# Patient Record
Sex: Male | Born: 1935 | Race: White | Hispanic: No | Marital: Married | Smoking: Never smoker
Health system: Southern US, Community
[De-identification: ages and names within clinical notes are randomized; demographics above are authoritative.]

## PROBLEM LIST (undated history)

## (undated) DIAGNOSIS — H269 Unspecified cataract: Secondary | ICD-10-CM

## (undated) DIAGNOSIS — C61 Malignant neoplasm of prostate: Secondary | ICD-10-CM

## (undated) DIAGNOSIS — J449 Chronic obstructive pulmonary disease, unspecified: Secondary | ICD-10-CM

## (undated) DIAGNOSIS — N400 Enlarged prostate without lower urinary tract symptoms: Secondary | ICD-10-CM

## (undated) DIAGNOSIS — R12 Heartburn: Secondary | ICD-10-CM

## (undated) DIAGNOSIS — E669 Obesity, unspecified: Secondary | ICD-10-CM

## (undated) DIAGNOSIS — I739 Peripheral vascular disease, unspecified: Secondary | ICD-10-CM

## (undated) DIAGNOSIS — M1990 Unspecified osteoarthritis, unspecified site: Secondary | ICD-10-CM

## (undated) DIAGNOSIS — K219 Gastro-esophageal reflux disease without esophagitis: Secondary | ICD-10-CM

## (undated) DIAGNOSIS — I10 Essential (primary) hypertension: Secondary | ICD-10-CM

## (undated) HISTORY — DX: Malignant neoplasm of prostate: C61

## (undated) HISTORY — PX: COLONOSCOPY W/ BIOPSIES AND POLYPECTOMY: SHX1376

## (undated) HISTORY — DX: Essential (primary) hypertension: I10

## (undated) HISTORY — DX: Heartburn: R12

## (undated) HISTORY — DX: Benign prostatic hyperplasia without lower urinary tract symptoms: N40.0

## (undated) HISTORY — PX: ROTATOR CUFF REPAIR: SHX139

## (undated) HISTORY — DX: Obesity, unspecified: E66.9

## (undated) HISTORY — DX: Chronic obstructive pulmonary disease, unspecified: J44.9

## (undated) HISTORY — PX: TONSILLECTOMY: SUR1361

## (undated) HISTORY — DX: Peripheral vascular disease, unspecified: I73.9

---

## 1999-07-11 ENCOUNTER — Other Ambulatory Visit: Admission: RE | Admit: 1999-07-11 | Discharge: 1999-07-11 | Payer: Self-pay | Admitting: Gastroenterology

## 2002-11-01 ENCOUNTER — Encounter: Payer: Self-pay | Admitting: Gastroenterology

## 2002-11-08 ENCOUNTER — Encounter: Payer: Self-pay | Admitting: Gastroenterology

## 2004-05-28 ENCOUNTER — Ambulatory Visit: Payer: Self-pay | Admitting: Internal Medicine

## 2004-07-09 ENCOUNTER — Ambulatory Visit: Payer: Self-pay | Admitting: Internal Medicine

## 2004-08-21 ENCOUNTER — Ambulatory Visit: Payer: Self-pay | Admitting: Internal Medicine

## 2004-11-21 ENCOUNTER — Ambulatory Visit: Payer: Self-pay | Admitting: Internal Medicine

## 2005-02-19 ENCOUNTER — Ambulatory Visit: Payer: Self-pay | Admitting: Internal Medicine

## 2005-02-20 ENCOUNTER — Ambulatory Visit: Payer: Self-pay

## 2005-08-25 ENCOUNTER — Ambulatory Visit: Payer: Self-pay | Admitting: Internal Medicine

## 2006-03-31 ENCOUNTER — Ambulatory Visit (HOSPITAL_COMMUNITY): Admission: RE | Admit: 2006-03-31 | Discharge: 2006-03-31 | Payer: Self-pay | Admitting: Orthopedic Surgery

## 2006-04-07 ENCOUNTER — Ambulatory Visit (HOSPITAL_COMMUNITY): Admission: RE | Admit: 2006-04-07 | Discharge: 2006-04-08 | Payer: Self-pay | Admitting: Orthopedic Surgery

## 2006-09-28 ENCOUNTER — Ambulatory Visit: Payer: Self-pay | Admitting: Internal Medicine

## 2006-09-28 LAB — CONVERTED CEMR LAB
ALT: 31 units/L (ref 0–53)
AST: 31 units/L (ref 0–37)
Albumin: 4.3 g/dL (ref 3.5–5.2)
BUN: 12 mg/dL (ref 6–23)
Basophils Absolute: 0 10*3/uL (ref 0.0–0.1)
Bilirubin Urine: NEGATIVE
Bilirubin, Direct: 0.2 mg/dL (ref 0.0–0.3)
CRP, High Sensitivity: <1 — ABNORMAL LOW (ref 0.00–5.00)
Chloride: 105 meq/L (ref 96–112)
Cholesterol: 147 mg/dL (ref 0–200)
Eosinophils Relative: 2.4 % (ref 0.0–5.0)
GFR calc Af Amer: 105 mL/min
GFR calc non Af Amer: 87 mL/min
Hemoglobin: 15 g/dL (ref 13.0–17.0)
LDL Cholesterol: 96 mg/dL (ref 0–99)
MCV: 91.8 fL (ref 78.0–100.0)
Nitrite: NEGATIVE
RBC: 4.69 M/uL (ref 4.22–5.81)
RDW: 12.5 % (ref 11.5–14.6)
TSH: 0.94 microintl units/mL (ref 0.35–5.50)
Total Bilirubin: 0.9 mg/dL (ref 0.3–1.2)
Total Protein: 6.9 g/dL (ref 6.0–8.3)
Triglycerides: 127 mg/dL (ref 0–149)
Urine Glucose: NEGATIVE mg/dL
VLDL: 25 mg/dL (ref 0–40)
WBC: 6.5 10*3/uL (ref 4.5–10.5)

## 2006-10-05 ENCOUNTER — Ambulatory Visit: Payer: Self-pay | Admitting: Internal Medicine

## 2006-10-05 LAB — CONVERTED CEMR LAB
BUN: 16 mg/dL (ref 6–23)
CO2: 31 meq/L (ref 19–32)
Chloride: 107 meq/L (ref 96–112)
Creatinine, Ser: 1.1 mg/dL (ref 0.4–1.5)
GFR calc Af Amer: 83 mL/min
GFR calc non Af Amer: 69 mL/min
Glucose, Bld: 97 mg/dL (ref 70–99)
Potassium: 4.6 meq/L (ref 3.5–5.1)
Sodium: 144 meq/L (ref 135–145)

## 2006-11-16 ENCOUNTER — Ambulatory Visit: Payer: Self-pay | Admitting: Internal Medicine

## 2007-08-16 ENCOUNTER — Encounter: Payer: Self-pay | Admitting: Internal Medicine

## 2007-08-16 DIAGNOSIS — E119 Type 2 diabetes mellitus without complications: Secondary | ICD-10-CM

## 2007-08-16 DIAGNOSIS — N138 Other obstructive and reflux uropathy: Secondary | ICD-10-CM

## 2007-08-16 DIAGNOSIS — I739 Peripheral vascular disease, unspecified: Secondary | ICD-10-CM

## 2007-08-16 DIAGNOSIS — I10 Essential (primary) hypertension: Secondary | ICD-10-CM

## 2007-08-16 DIAGNOSIS — R0609 Other forms of dyspnea: Secondary | ICD-10-CM

## 2007-08-16 DIAGNOSIS — R0989 Other specified symptoms and signs involving the circulatory and respiratory systems: Secondary | ICD-10-CM | POA: Insufficient documentation

## 2007-08-16 DIAGNOSIS — N401 Enlarged prostate with lower urinary tract symptoms: Secondary | ICD-10-CM

## 2007-08-16 DIAGNOSIS — Z8719 Personal history of other diseases of the digestive system: Secondary | ICD-10-CM

## 2007-08-17 ENCOUNTER — Ambulatory Visit: Payer: Self-pay | Admitting: Internal Medicine

## 2007-09-28 ENCOUNTER — Encounter: Payer: Self-pay | Admitting: Internal Medicine

## 2007-10-29 ENCOUNTER — Ambulatory Visit: Payer: Self-pay | Admitting: Internal Medicine

## 2007-10-29 DIAGNOSIS — J449 Chronic obstructive pulmonary disease, unspecified: Secondary | ICD-10-CM | POA: Insufficient documentation

## 2007-10-29 LAB — CONVERTED CEMR LAB
Alkaline Phosphatase: 65 units/L (ref 39–117)
Basophils Relative: 0.1 % (ref 0.0–3.0)
Bilirubin, Direct: 0.1 mg/dL (ref 0.0–0.3)
Calcium: 9.1 mg/dL (ref 8.4–10.5)
Chloride: 105 meq/L (ref 96–112)
Cholesterol: 168 mg/dL (ref 0–200)
Creatinine, Ser: 0.9 mg/dL (ref 0.4–1.5)
Eosinophils Absolute: 0.4 10*3/uL (ref 0.0–0.7)
GFR calc Af Amer: 105 mL/min
GFR calc non Af Amer: 87 mL/min
HCT: 43.3 % (ref 39.0–52.0)
HDL: 22.3 mg/dL — ABNORMAL LOW (ref >39.0)
Hemoglobin: 14.7 g/dL (ref 13.0–17.0)
Leukocytes, UA: NEGATIVE
Lymphocytes Relative: 29.3 % (ref 12.0–46.0)
Monocytes Absolute: 0.6 10*3/uL (ref 0.1–1.0)
Neutro Abs: 3.7 10*3/uL (ref 1.4–7.7)
Neutrophils Relative %: 55.6 % (ref 43.0–77.0)
Potassium: 4.3 meq/L (ref 3.5–5.1)
RBC: 4.58 M/uL (ref 4.22–5.81)
RDW: 12.1 % (ref 11.5–14.6)
Sodium: 139 meq/L (ref 135–145)
Specific Gravity, Urine: 1.01 (ref 1.000–1.03)
Total CHOL/HDL Ratio: 7.5
Total Protein: 6.8 g/dL (ref 6.0–8.3)
Triglycerides: 102 mg/dL (ref 0–149)
Urine Glucose: NEGATIVE mg/dL
VLDL: 20 mg/dL (ref 0–40)

## 2008-03-02 ENCOUNTER — Ambulatory Visit: Payer: Self-pay | Admitting: Internal Medicine

## 2008-03-08 LAB — CONVERTED CEMR LAB
BUN: 14 mg/dL (ref 6–23)
CO2: 29 meq/L (ref 19–32)
Chloride: 104 meq/L (ref 96–112)
Microalb Creat Ratio: 4.5 mg/g (ref 0.0–30.0)
Potassium: 5.1 meq/L (ref 3.5–5.1)
Sodium: 139 meq/L (ref 135–145)

## 2008-05-11 ENCOUNTER — Telehealth (INDEPENDENT_AMBULATORY_CARE_PROVIDER_SITE_OTHER): Payer: Self-pay | Admitting: *Deleted

## 2008-06-19 ENCOUNTER — Encounter: Payer: Self-pay | Admitting: Internal Medicine

## 2008-07-19 ENCOUNTER — Encounter: Payer: Self-pay | Admitting: Internal Medicine

## 2008-08-31 ENCOUNTER — Ambulatory Visit: Payer: Self-pay | Admitting: Internal Medicine

## 2008-10-18 ENCOUNTER — Encounter: Payer: Self-pay | Admitting: Internal Medicine

## 2008-11-09 ENCOUNTER — Ambulatory Visit: Payer: Self-pay | Admitting: Internal Medicine

## 2008-11-09 DIAGNOSIS — J984 Other disorders of lung: Secondary | ICD-10-CM | POA: Insufficient documentation

## 2008-11-13 LAB — CONVERTED CEMR LAB
AST: 23 units/L (ref 0–37)
Albumin: 4.4 g/dL (ref 3.5–5.2)
BUN: 13 mg/dL (ref 6–23)
Basophils Absolute: 0 10*3/uL (ref 0.0–0.1)
Basophils Relative: 0.1 % (ref 0.0–3.0)
Bilirubin Urine: NEGATIVE
Bilirubin, Direct: 0.1 mg/dL (ref 0.0–0.3)
Creatinine, Ser: 0.9 mg/dL (ref 0.4–1.5)
Eosinophils Relative: 1.5 % (ref 0.0–5.0)
GFR calc non Af Amer: 86.16 mL/min (ref >60)
Glucose, Bld: 124 mg/dL — ABNORMAL HIGH (ref 70–99)
Hemoglobin: 15.3 g/dL (ref 13.0–17.0)
Ketones, ur: NEGATIVE mg/dL
LDL Cholesterol: 115 mg/dL — ABNORMAL HIGH (ref 0–99)
Leukocytes, UA: NEGATIVE
Lymphocytes Relative: 26.2 % (ref 12.0–46.0)
Lymphs Abs: 1.7 10*3/uL (ref 0.7–4.0)
MCHC: 33.6 g/dL (ref 30.0–36.0)
MCV: 94.8 fL (ref 78.0–100.0)
Monocytes Relative: 8 % (ref 3.0–12.0)
Neutro Abs: 4.1 10*3/uL (ref 1.4–7.7)
Nitrite: NEGATIVE
RBC: 4.8 M/uL (ref 4.22–5.81)
RDW: 12.3 % (ref 11.5–14.6)
Sodium: 140 meq/L (ref 135–145)
Specific Gravity, Urine: <=1.005 (ref 1.000–1.030)
TSH: 0.89 microintl units/mL (ref 0.35–5.50)
Total Bilirubin: 0.9 mg/dL (ref 0.3–1.2)
Urobilinogen, UA: 0.2 (ref 0.0–1.0)
WBC: 6.4 10*3/uL (ref 4.5–10.5)
pH: 7 (ref 5.0–8.0)

## 2009-02-07 ENCOUNTER — Ambulatory Visit: Payer: Self-pay | Admitting: Internal Medicine

## 2009-02-07 DIAGNOSIS — M25569 Pain in unspecified knee: Secondary | ICD-10-CM | POA: Insufficient documentation

## 2009-04-20 ENCOUNTER — Encounter: Payer: Self-pay | Admitting: Internal Medicine

## 2009-05-08 ENCOUNTER — Ambulatory Visit: Payer: Self-pay | Admitting: Internal Medicine

## 2009-05-28 ENCOUNTER — Encounter: Payer: Self-pay | Admitting: Internal Medicine

## 2009-07-12 ENCOUNTER — Encounter (INDEPENDENT_AMBULATORY_CARE_PROVIDER_SITE_OTHER): Payer: Self-pay | Admitting: *Deleted

## 2009-07-19 ENCOUNTER — Encounter (INDEPENDENT_AMBULATORY_CARE_PROVIDER_SITE_OTHER): Payer: Self-pay | Admitting: *Deleted

## 2009-07-30 ENCOUNTER — Telehealth: Payer: Self-pay | Admitting: Internal Medicine

## 2009-08-01 ENCOUNTER — Ambulatory Visit: Payer: Self-pay | Admitting: Gastroenterology

## 2009-08-01 DIAGNOSIS — K5730 Diverticulosis of large intestine without perforation or abscess without bleeding: Secondary | ICD-10-CM | POA: Insufficient documentation

## 2009-10-08 ENCOUNTER — Ambulatory Visit: Payer: Self-pay | Admitting: Internal Medicine

## 2009-10-08 LAB — CONVERTED CEMR LAB
ALT: 19 units/L (ref 0–53)
AST: 20 units/L (ref 0–37)
Basophils Absolute: 0.1 10*3/uL (ref 0.0–0.1)
CO2: 31 meq/L (ref 19–32)
Eosinophils Absolute: 0.2 10*3/uL (ref 0.0–0.7)
Eosinophils Relative: 2.2 % (ref 0.0–5.0)
HCT: 43.7 % (ref 39.0–52.0)
HDL: 32.3 mg/dL — ABNORMAL LOW (ref >39.00)
MCHC: 34.8 g/dL (ref 30.0–36.0)
Monocytes Absolute: 0.6 10*3/uL (ref 0.1–1.0)
Monocytes Relative: 9.3 % (ref 3.0–12.0)
Platelets: 220 10*3/uL (ref 150.0–400.0)
Potassium: 5.2 meq/L — ABNORMAL HIGH (ref 3.5–5.1)
Sodium: 140 meq/L (ref 135–145)
TSH: 0.82 microintl units/mL (ref 0.35–5.50)
Total CHOL/HDL Ratio: 5
Triglycerides: 166 mg/dL — ABNORMAL HIGH (ref 0.0–149.0)
VLDL: 33.2 mg/dL (ref 0.0–40.0)

## 2009-11-21 ENCOUNTER — Encounter: Payer: Self-pay | Admitting: Internal Medicine

## 2010-04-11 NOTE — Letter (Signed)
Summary: [facility]  [facility]   Imported By: Sherian Rein 04/27/2009 10:08:43  _____________________________________________________________________  External Attachment:    Type:   Image     Comment:   External Document

## 2010-04-11 NOTE — Assessment & Plan Note (Signed)
Summary: Primary svc/ f/u    Primary Provider/Referring Provider:  [name]  CC:  3 month followup.  Pt denies any complaints today.[name]  History of Present Illness: 77  yowm quit smoking 1980  with HBP and chronic nonprogressive doe and PFT's  11/16/2006 FEV1 112% but ratio 57% no change ex tol .  main problem occurs walking uphill carrying buckets of beans not bothered by cough, daytime or nocturnal. no variablity on good days vs bad, every day is the same.  He has has also developed aodm II controlled with diet since 2008, no overt symptoms  10/29/07 cpx fbx 146,  hdl 26  March 02, 2008  working on wt loss thru diet but no ex.  no  polyuria/dypsia.   November 09, 2008 comprehensive eval   ov for bp check with  no tia or claudicaiton symptoms, cbg's typically < 120, no sob though avoiding ex due to heat plays golf.   February 07, 2009 3 month followup.  Pain in rt calf has resolved.  Pt c/o pain in both knees- especially at night and when walking up and down steps.  aleve helps but rarely using.  May 08, 2009 3 month followup.   Still rarely uses aleve despite new right lateral thigh/knee pain esp when lying down. no back pain, once a week maybe twice, no more, no  back pain or leg weakness. Pt denies any significant sore throat, dysphagia, itching, sneezing,  nasal congestion or excess secretions,  fever, chills, sweats, unintended wt loss, pleuritic or exertional cp, hempoptysis, change in activity tolerance  orthopnea pnd or leg swelling   Current Medications (verified): 1)  Aspirin 81 Mg  Tbec (Aspirin) .... Once Daily 2)  Micardis 20 Mg  Tabs (Telmisartan) .... Once Daily 3)  Aleve 220 Mg  Tabs (Naproxen Sodium) .... As Needed 4)  Finasteride 5 Mg Tabs (Finasteride) .[name].. 1 Once Daily 5)  Tamsulosin Hcl 0.4 Mg Caps (Tamsulosin Hcl) .[name].. 1 Once Daily  Allergies (verified): No Known Drug Allergies  Past History:  Past Medical History: Obesity     -  Target wt  =   171 for BMI <  26  HYPERTENSION (ICD-401.9)   - neg cardiolyte 12/06 DM (ICD-250.00)   - dx 7/08  BENIGN PROSTATIC HYPERTROPHY, HX OF (ICD-V13.8) fu by [name] with elevated psa DIVERTICULITIS, HX OF (ICD-V12.79................................................[name]    - colonoscopy 11/08/02 PVD (ICD-443.9)   - asym pedal pulses, asymptomatic COPD    -  PFT's 11/16/06 FEV1 3.0  (112%) with ratio 57 % and no rev, nl dlco Health Maintenance............................................................................[name]    - Td  2006,   -  Pneumovax 2004   -  CPX November 09, 2008     Vital Signs:  Patient profile:   77 year old male Weight:      188 pounds O2 Sat:      94 % on Room air Temp:     97.7 degrees F oral Pulse rate:   88 / minute BP sitting:   142 / 70  (left arm)  Vitals Entered By: Vernie Murders (May 08, 2009 9:11 AM)  O2 Flow:  Room air  Physical Exam  Additional Exam:  Robust wm, alert and approp ambulatory wm nad  wt  187 October 29, 2007 > 182 November 09, 2008 > 186 February 07, 2009 > 188 May 08, 2009 > 188 May 09, 2009  HEENT:dention ok with lower dentures, nl  turbinates, and orophanx.  Neck without JVD/Nodes/TM Lungs clear to A and P bilaterally without cough on insp or exp maneuvers, mild pectus excavatum and barrel chest RRR no s3 or murmur or increase in P2. no edema. Pulses in feet slt asym esp reduced on right  Abd soft and benign with nl excursion in the supine position.  Ext warm without calf tenderness, cyanosis clubbing  MS  nl gait, no limitations on joint manipulation of neck, shoulders, hips, knees, but mild crepitance both knees   Impression & Recommendations:  Problem # 1:  ARTHRALGIA UNSPECIFIED SITE (ICD-719.40) no evidence of sign radiculopathy or back issues, so  ok to rx with nsaids only  Problem # 2:  HYPERTENSION (ICD-401.9)  His updated medication list for this problem includes:    Micardis 20 Mg Tabs (Telmisartan) ..... Once  daily    Each maintenance medication was reviewed in detail including most importantly the difference between maintenance and as needed and under what circumstances the prns are to be used. See instructions for specific recommendations   Medications Added to Medication List This Visit: 1)  Tamsulosin Hcl 0.4 Mg Caps (Tamsulosin hcl) .[name].. 1 once daily  Other Orders: Est. Patient Level III ([address])  Patient Instructions: 1)  Aleve with meals will help with joint muscle pain 2)  Please schedule a follow-up appointment in 6 months for CPX

## 2010-04-11 NOTE — Letter (Signed)
[facility]   Imported By: Sherian Rein 06/07/2009 14:17:44  _____________________________________________________________________  External Attachment:    Type:   Image     Comment:   External Document

## 2010-04-11 NOTE — Progress Notes (Signed)
Summary: refill  Phone Note Call from Patient Call back at Home Phone [phone]   Caller: Spouse//lucille Call For: Voncille Simm Reason for Call: Refill Medication Summary of Call: Needs refill on micardis 20mg , pharmacy [facility] [address]. Initial call taken by: Darletta Moll,  Jul 30, 2009 11:45 AM  Follow-up for Phone Call        pt wife requests 90 day supply, rx sent. Carron Curie CMA  Jul 30, 2009 11:54 AM     Prescriptions: MICARDIS 20 MG  TABS (TELMISARTAN) once daily  #90 x 3   Entered by:   Carron Curie CMA   Authorized by:   Nyoka Cowden MD   Signed by:   Carron Curie CMA on 07/30/2009   Method used:   [facility]        [facility]  [address] [phone][facility]ail[facility]       [address]       [place], [place]  [id number]       Ph: [phone] or [phone]       Fax: [phone]   RxID:   [id number]

## 2010-04-11 NOTE — Assessment & Plan Note (Signed)
Summary: REC OV/COL DUE,OVER 82.Marland KitchenMarland KitchenAS.   History of Present Illness Visit Type: new patient  Primary GI MD: Melvia Heaps MD [facility] Primary Provider: Sandrea Hughs, MD  Requesting Provider: na Chief Complaint: Consult colon. Pt c/o  GERD and belching  History of Present Illness:   Peter Gross is a pleasant 77 year old white male referred at the request of Dr. [name] for possible colonoscopy.  At last colonoscopy in 2004 severe diverticulosis was seen.  A non-adenomatous polyp was removed in 2001.  Peter Gross has no lower GI complaints including change of bowel habits, abdominal pain, melena or hematochezia.  He has occasional pyrosis for which he takes Prevacid as needed.   GI Review of Systems    Reports acid reflux, belching, and  heartburn.      Denies abdominal pain, bloating, chest pain, dysphagia with liquids, dysphagia with solids, loss of appetite, nausea, vomiting, vomiting blood, weight loss, and  weight gain.        Denies anal fissure, black tarry stools, change in bowel habit, constipation, diarrhea, diverticulosis, fecal incontinence, heme positive stool, hemorrhoids, irritable bowel syndrome, jaundice, light color stool, liver problems, rectal bleeding, and  rectal pain.    Current Medications (verified): 1)  Aspirin 81 Mg  Tbec (Aspirin) .... Once Daily 2)  Micardis 20 Mg  Tabs (Telmisartan) .... Once Daily 3)  Aleve 220 Mg  Tabs (Naproxen Sodium) .... As Needed 4)  Finasteride 5 Mg Tabs (Finasteride) .[name].. 1 Once Daily 5)  Tamsulosin Hcl 0.4 Mg Caps (Tamsulosin Hcl) .[name].. 1 Once Daily 6)  Prevacid 24hr 15 Mg Cpdr (Lansoprazole) .... As Needed  Allergies (verified): No Known Drug Allergies  Past History:  Past Medical History: Reviewed history from 07/26/2009 and no changes required. Obesity     -  Target wt  =   171 for BMI < 26  HYPERTENSION (ICD-401.9)   - neg cardiolyte 12/06 DM (ICD-250.00)   - dx 7/08  BENIGN PROSTATIC HYPERTROPHY, HX OF (ICD-V13.8) fu  by [name] with elevated psa DIVERTICULITIS, HX OF (ICD-V12.79................................................[name]    - colonoscopy 11/08/02 PVD (ICD-443.9)   - asym pedal pulses, asymptomatic COPD    -  PFT's 11/16/06 FEV1 3.0  (112%) with ratio 57 % and no rev, nl dlco Health Maintenance............................................................................[name]    - Td  2006,   -  Pneumovax 2004   -  CPX November 09, 2008  Hx of peptic ulcer disease  Past Surgical History: Reviewed history from 07/26/2009 and no changes required. Left rotator cuff surgery Gioffre 1/08 Tonsillectomy  Family History: HBP mother 2 siblings healthy No FH of Colon Cancer:  Social History: Retired--Telephone Artist  Married 3 Childern  Quit smoking 1980 No ETOH Daily Caffeine Use: 2 daily  Illicit Drug Use - no Drug Use:  no  Review of Systems       The patient complains of arthritis/joint pain, back pain, and muscle pains/cramps.  The patient denies allergy/sinus, anemia, anxiety-new, blood in urine, breast changes/lumps, change in vision, confusion, cough, coughing up blood, depression-new, fainting, fatigue, fever, headaches-new, hearing problems, heart murmur, heart rhythm changes, itching, night sweats, nosebleeds, shortness of breath, skin rash, sleeping problems, sore throat, swelling of feet/legs, swollen lymph glands, thirst - excessive, urination - excessive, urination changes/pain, urine leakage, vision changes, and voice change.         All other systems were reviewed and were negative   Vital Signs:  Patient profile:   77 year old male Height:  68.5 inches Weight:      184 pounds BMI:     27.67 BSA:     1.99 Pulse rate:   92 / minute Pulse rhythm:   regular BP sitting:   138 / 82  (left arm) Cuff size:   regular  Vitals Entered By: Ok Anis CMA (Aug 01, 2009 9:59 AM)  Physical Exam  Additional Exam:  On physical exam he is a healthy-appearing  male  skin: anicteric HEENT: normocephalic; PEERLA; no nasal or pharyngeal abnormalities neck: supple nodes: no cervical lymphadenopathy chest: clear to ausculatation and percussion heart: no murmurs, gallops, or rubs abd: soft, nontender; BS normoactive; no abdominal masses, tenderness, organomegaly rectal: deferred ext: no cynanosis, clubbing, edema skeletal: no deformities neuro: oriented x 3; no focal abnormalities    Impression & Recommendations:  Problem # 1:  DIVERTICULOSIS OF COLON (ICD-562.10) In the absence of GI complaints I do not think routine colonoscopy at this point is indicated.  Patient Instructions: 1)  Please call our office with any gastroentestinal concerns. 2)  Copy sent to : Sandrea Hughs, MD 3)  The medication list was reviewed and reconciled.  All changed / newly prescribed medications were explained.  A complete medication list was provided to the patient / caregiver. 4)  Diverticular Disease brochure given.

## 2010-04-11 NOTE — Assessment & Plan Note (Signed)
Summary: Primary svc/ cpx   Copy to:  na Primary Provider/Referring Provider:  Sandrea Hughs, MD   CC:  5 month followup.  Pt states doing well and denies any complaints today.Peter Gross  History of Present Illness: 9  yowm quit smoking 1980  with HBP and chronic nonprogressive doe and PFT's  11/16/2006 FEV1 112% but ratio 57% no change ex tol .  main problem occurs walking uphill carrying buckets of beans not bothered by cough, daytime or nocturnal. no variablity on good days vs bad, every day is the same.  He has has also developed aodm II controlled with diet since 2008, no overt symptoms  10/29/07 cpx fbx 146,  hdl 26  March 02, 2008  working on wt loss thru diet but no ex.  no  polyuria/dypsia.   November 09, 2008 comprehensive eval   ov for bp check with  no tia or claudicaiton symptoms, cbg's typically < 120, no sob though avoiding ex due to heat plays golf.   February 07, 2009 3 month followup.  Pain in rt calf has resolved.  Pt c/o pain in both knees- especially at night and when walking up and down steps.  aleve helps but rarely using.  May 08, 2009 3 month followup.   Still rarely uses aleve despite new right lateral thigh/knee pain esp when lying down. no back pain, once a week maybe twice, no more, no  back pain or leg weakness.  October 08, 2009 5 month followup.  Pt states doing well and denies any complaints today.  Current Medications (verified): 1)  Aspirin 81 Mg  Tbec (Aspirin) .... Once Daily 2)  Micardis 20 Mg  Tabs (Telmisartan) .... Once Daily 3)  Aleve 220 Mg  Tabs (Naproxen Sodium) .... As Needed 4)  Finasteride 5 Mg Tabs (Finasteride) .Peter Gross.. 1 Once Daily 5)  Tamsulosin Hcl 0.4 Mg Caps (Tamsulosin Hcl) .Peter Gross.. 1 Once Daily 6)  Prevacid 24hr 15 Mg Cpdr (Lansoprazole) .... As Needed  Allergies (verified): No Known Drug Allergies  Past History:  Past Medical History: Obesity     -  Target wt  =   171 for BMI < 26  HYPERTENSION (ICD-401.9)   - neg cardiolyte  12/06 DM (ICD-250.00)   - dx 7/08  BENIGN PROSTATIC HYPERTROPHY, HX OF (ICD-V13.8) fu by [name] with elevated psa DIVERTICULITIS, HX OF (ICD-V12.79................................................Peter KitchenArlyce Dice    - colonoscopy 11/08/02 PVD (ICD-443.9)   - asym pedal pulses, asymptomatic COPD    -  PFT's 11/16/06 FEV1 3.0  (112%) with ratio 57 % and no rev, nl dlco Health Maintenance............................................................................Peter KitchenWert    - Td  2006,   -  Pneumovax 2004   -  CPX October 08, 2009  Hx of peptic ulcer disease  Family History: HBP mother Dementia / ? cva Father 2 siblings healthy  No FH of Colon Cancer:  Social History: Retired--Telephone Artist, still active  Married 3 Childern  Quit smoking 1980 No ETOH Daily Caffeine Use: 2 daily  Illicit Drug Use - no  Vital Signs:  Patient profile:   77 year old male Weight:      181 pounds O2 Sat:      94 % on Room air Temp:     97.5 degrees F oral Pulse rate:   75 / minute BP sitting:   112 / 70  (left arm)  Vitals Entered By: Vernie Murders (October 08, 2009 8:42 AM)  O2 Flow:  Room air  Physical Exam  Additional Exam:  Robust wm, alert and approp ambulatory wm nad  wt  187 October 29, 2007   > 188 May 08, 2009 > 188 May 09, 2009 > 181 October 08, 2009  HEENT:dention ok with lower dentures, nl  turbinates, and orophanx.  Neck without JVD/Nodes/TM Lungs clear to A and P bilaterally without cough on insp or exp maneuvers, mild pectus excavatum and barrel chest RRR no s3 or murmur or increase in P2. no edema. Pulses in feet slt asym esp reduced on right  Abd soft and benign with nl excursion in the supine position.  Ext warm without calf tenderness, cyanosis clubbing  MS  nl gait, no limitations on joint manipulation of neck, shoulders, hips, knees, but mild crepitance both knees Skin no lesions    Cholesterol               164 mg/dL                   5-621     ATP III  Classification            Desirable:  < 200 mg/dL                    Borderline High:  200 - 239 mg/dL               High:  > = 240 mg/dL   Triglycerides        [H]  166.0 mg/dL                 3.0-865.7     Normal:  <150 mg/dL     Borderline High:  846 - 199 mg/dL   HDL                  [L]  96.29 mg/dL                 >52.84   VLDL Cholesterol          33.2 mg/dL                  1.3-24.4   LDL Cholesterol           99 mg/dL                    0-10  CHO/HDL Ratio:  CHD Risk                             5                    Men          Women     1/2 Average Risk     3.4          3.3     Average Risk          5.0          4.4     2X Average Risk          9.6          7.1     3X Average Risk          15.0          11.0  Tests: (2) BMP (METABOL)   Sodium                    140 mEq/L                   135-145   Potassium            [H]  5.2 mEq/L                   3.5-5.1   Chloride                  104 mEq/L                   96-112   Carbon Dioxide            31 mEq/L                    19-32   Glucose              [H]  140 mg/dL                   53-66   BUN                       13 mg/dL                    4-40   Creatinine                0.8 mg/dL                   3.4-7.4   Calcium                   9.8 mg/dL                   2.5-95.6   GFR                       104.48 mL/min               >60  Tests: (3) TSH (TSH)   FastTSH                   0.82 uIU/mL                 0.35-5.50  Tests: (4) Hepatic/Liver Function Panel (HEPATIC)   Total Bilirubin           0.8 mg/dL                   3.8-7.5   Direct Bilirubin          0.2 mg/dL                   6.4-3.3   Alkaline Phosphatase      62 U/L                      39-117   AST                       20 U/L                      0-37   ALT                       19  U/L                      0-53   Total Protein             7.2 g/dL                    1.6-1.0   Albumin                   4.4 g/dL                     9.6-0.4  Tests: (5) CBC Platelet w/Diff (CBCD)   White Cell Count          6.8 K/uL                    4.5-10.5   Red Cell Count            4.69 Mil/uL                 4.22-5.81   Hemoglobin                15.2 g/dL                   54.0-98.1   Hematocrit                43.7 %                      39.0-52.0   MCV                       93.2 fl                     78.0-100.0   MCHC                      34.8 g/dL                   19.1-47.8   RDW                       13.0 %                      11.5-14.6   Platelet Count            220.0 K/uL                  150.0-400.0   Neutrophil %              62.6 %                      43.0-77.0   Lymphocyte %              25.1 %                      12.0-46.0   Monocyte %                9.3 %                       3.0-12.0   Eosinophils%              2.2 %  0.0-5.0   Basophils %               0.8 %                       0.0-3.0   Neutrophill Absolute      4.3 K/uL                    1.4-7.7   Lymphocyte Absolute       1.7 K/uL                    0.7-4.0   Monocyte Absolute         0.6 K/uL                    0.1-1.0  Eosinophils, Absolute                             0.2 K/uL                    0.0-0.7   Basophils Absolute        0.1 K/uL                    0.0-0.1  Tests: (6) Hemoglobin A1C (A1C)   Hemoglobin A1C       [H]  6.7 %                       4.6-6.5  CXR  Procedure date:  10/08/2009  Findings:      Comparison: 02/07/2009   Findings: Trachea is midline.  Heart size normal.  Biapical pleural thickening.  Lungs are clear.  No pleural fluid.  There is flattening of the hemidiaphragms with enlargement of the retrosternal clear space.   IMPRESSION: COPD without acute finding.  Impression & Recommendations:  Problem # 1:  COPD UNSPECIFIED (ICD-496)  not limited though becoming more sedentary, advised regular ex in cooler  envirnoment when feasible  Problem # 2:  HYPERTENSION (ICD-401.9)  His  updated medication list for this problem includes:    Micardis 20 Mg Tabs (Telmisartan) ..... Once daily     BP today: 112/70 Prior BP: 138/82 (08/01/2009)  Labs Reviewed: K+: 4.8 (11/09/2008) Creat: : 0.9 (11/09/2008)   Chol: 173 (11/09/2008)   HDL: 37.20 (11/09/2008)   LDL: 115 (11/09/2008)   TG: 103.0 (11/09/2008)  Problem # 3:  DM (ICD-250.00)  His updated medication list for this problem includes:    Aspirin 81 Mg Tbec (Aspirin) ..... Once daily    Labs Reviewed: Creat: 0.9 (11/09/2008)    Reviewed HgBA1c results: 6.4 (11/09/2008) > 6.7  October 08, 2009 , given age will continue diet rx as risk hypoglycemia with medical rx   6.6 (03/02/2008)  Problem # 4:  ARTHRALGIA UNSPECIFIED SITE (ICD-719.40)  no evidence of sign radiculopathy or back issues, so  ok to rx with nsaids only  Other Orders: Est. Patient 65& > (04540) EKG w/ Interpretation (93000) T-2 View CXR (71020TC) TLB-Lipid Panel (80061-LIPID) TLB-BMP (Basic Metabolic Panel-BMET) (80048-METABOL) TLB-TSH (Thyroid Stimulating Hormone) (84443-TSH) TLB-Hepatic/Liver Function Pnl (80076-HEPATIC) TLB-CBC Platelet - w/Differential (85025-CBCD) TLB-A1C / Hgb A1C (Glycohemoglobin) (83036-A1C)  Patient Instructions: 1)  Return to office in 6 months, sooner if needed  2)  ok to use aleve as needed with meals for joint pain 3)  Call [phone] for your results w/in next  3 days - if there's something important  I feel you need to know,  I'll be in touch with you directly.    [facility] ECG  ID: [id number] Patient: CARTIER, WASHKO DOB: 1926-04-02 Age: 77 Years Old Sex: Male Race: White Height: 68.5 Weight: 181 Status: Unconfirmed Past Medical History:  Obesity     -  Target wt  =   171 for BMI < 26  HYPERTENSION (ICD-401.9)   - neg cardiolyte 12/06 DM (ICD-250.00)   - dx 7/08  BENIGN PROSTATIC HYPERTROPHY, HX OF (ICD-V13.8) fu by [name] with elevated psa DIVERTICULITIS, HX OF  (ICD-V12.79................................................Peter KitchenArlyce Dice    - colonoscopy 11/08/02 PVD (ICD-443.9)   - asym pedal pulses, asymptomatic COPD    -  PFT's 11/16/06 FEV1 3.0  (112%) with ratio 57 % and no rev, nl dlco Health Maintenance............................................................................Peter KitchenWert    - Td  2006,   -  Pneumovax 2004   -  CPX November 09, 2008  Hx of peptic ulcer disease Recorded: 10/08/2009 09:15 AM P/PR: 115 ms / 180 ms - Heart rate (maximum exercise) QRS: 97 QT/QTc/QTd: 377 ms / 410 ms / 52 ms - Heart rate (maximum exercise)  P/QRS/T axis: 53 deg / 24 deg / 58 deg - Heart rate (maximum exercise)  Heartrate: 79 bpm  Interpretation:   sinus rhythm  premature ventricular complexes with variable coupling intervals,   consider ventricular parasystole   Borderline ECG

## 2010-04-11 NOTE — Letter (Signed)
Summary: New Patient letter  [facility]  [address] [place], [place] [address]   Phone: [phone]  Fax: [phone]       07/19/2009 MRN: [id number]  Peter Gross [address] [place], [place]  [address]  Dear Peter Gross,  Welcome to the Gastroenterology Division at [facility].    You are scheduled to see Dr. [name] on 08/01/2009 at 10:00AM on the 3rd floor at [facility], [address]  We ask that you try to arrive at our office 15 minutes prior to your appointment time to allow for check-in.  We would like you to complete the enclosed self-administered evaluation form prior to your visit and bring it with you on the day of your appointment.  We will review it with you.  Also, please bring a complete list of all your medications or, if you prefer, bring the medication bottles and we will list them.  Please bring your insurance card so that we may make a copy of it.  If your insurance requires a referral to see a specialist, please bring your referral form from your primary care physician.  Co-payments are due at the time of your visit and may be paid by cash, check or credit card.     Your office visit will consist of a consult with your physician (includes a physical exam), any laboratory testing he/she may order, scheduling of any necessary diagnostic testing (e.g. x-ray, ultrasound, CT-scan), and scheduling of a procedure (e.g. Endoscopy, Colonoscopy) if required.  Please allow enough time on your schedule to allow for any/all of these possibilities.    If you cannot keep your appointment, please call [phone] to cancel or reschedule prior to your appointment date.  This allows [name] the opportunity to schedule an appointment for another patient in need of care.  If you do not cancel or reschedule by 5 p.m. the business day prior to your appointment date, you will be charged a $50.00 late cancellation/no-show fee.    Thank you for  choosing [facility] for your medical needs.  We appreciate the opportunity to care for you.  Please visit [name] at our website  to learn more about our practice.                     Sincerely,                                                             The Gastroenterology Division

## 2010-04-11 NOTE — Procedures (Signed)
Summary: EGD   EGD  Procedure date:  11/08/2002  Findings:      Findings: Esophagitis  Findings: Gastritis  Location: [facility]   Patient Name: Peter Gross, Peter Gross MRN:  Procedure Procedures: Panendoscopy (EGD) CPT: 43235.    with biopsy(s)/brushing(s). CPT: [id number].  Personnel: Endoscopist: Barbette Hair. [name], MD.  Indications  Evaluation of: Positive fecal occult blood test  History  Pre-Exam Physical: Performed Nov 08, 2002  Entire physical exam was normal.  Exam Exam Info: Maximum depth of insertion Duodenum, intended Duodenum. Vocal cords visualized. Gastric retroflexion performed. ASA Classification: II. Tolerance: good.  Sedation Meds: Robinul 0.2 given IV. Fentanyl 50 mcg. given IV. Versed 3 mg. given IV. Cetacaine Spray 2 sprays given aerosolized.  Monitoring: BP and pulse monitoring done. Oximetry used. Supplemental O2 given at 2 Liters.  Findings ESOPHAGEAL INFLAMMATION: Proximal margin 36 cm from mouth,  distal margin 37 cm. Length of inflammation: 1 cm. Edema present. [place] Classification: Grade A. Biopsy/Esoph Inflamtn taken. ICD9: Esophagitis: 530.10. Comments: Bxs taken to r/o Barrett's.  - MUCOSAL ABNORMALITY: Body to Antrum. Erythematous mucosa. Mottled mucosa. Biopsy/Mucosal Abn taken. ICD9: Gastritis, Chronic: 535.10.   Assessment Abnormal examination, see findings above.  Diagnoses: 535.10: Gastritis, Chronic.  530.10: Esophagitis.   Events  Unplanned Intervention: No unplanned interventions were required.  Unplanned Events: There were no complications. Plans Medication(s): Await pathology.  Scheduling: Home stool hemocults, around Nov 15, 2002.    This report was created from the original endoscopy report, which was reviewed and signed by the above listed endoscopist.

## 2010-04-11 NOTE — Letter (Signed)
Summary: Colonoscopy-Changed to Office Visit Letter   Gastroenterology  [address] [place], [place] [address]   Phone: [phone]  Fax: [phone]      Jul 12, 2009 MRN: [id number]   Peter Gross [address] [place], [place]  [address]   Dear [name],   According to our records, it is time for you to schedule a Colonoscopy. However, after reviewing your medical record, I feel that an office visit would be most appropriate to more completely evaluate you and determine your need for a repeat procedure.  Please call [phone] (option #2) at your convenience to schedule an office visit. If you have any questions, concerns, or feel that this letter is in error, we would appreciate your call.   Sincerely,  Barbette Hair. [name], M.D.  [facility] [phone]

## 2010-04-11 NOTE — Procedures (Signed)
Summary: colonoscopy   Colonoscopy  Procedure date:  11/01/2002  Findings:      Results: Diverticulosis.       Location:  [facility].    Colonoscopy  Procedure date:  11/01/2002  Findings:      Results: Diverticulosis.       Location:  [facility].   Patient Name: Peter Gross, Peter Gross MRN:  Procedure Procedures: Colonoscopy CPT: [phone].  Personnel: Endoscopist: Barbette Hair. [name], MD.  Referred By: Sandrea Hughs, MD.  Indications  Evaluation of: Anemia with low ferritin.  History  Pre-Exam Physical: Performed Nov 01, 2002. Entire physical exam was normal.  Exam Exam: Extent of exam reached: Cecum, extent intended: Cecum.  The cecum was identified by IC valve. Colon retroflexion performed. ASA Classification: II. Tolerance: good.  Monitoring: Pulse and BP monitoring, Oximetry used. Supplemental O2 given. at 2 Liters.  Colon Prep Used Golytely for colon prep. Prep results: good.  Sedation Meds: Fentanyl 50 mcg. given IV. Versed 5 mg. given IV.  Findings - DIVERTICULOSIS: Descending Colon to Sigmoid Colon. ICD9: Diverticulosis, Colon: 562.10. Comments: Multiple tics with lumenal narrowing and muscular hypertrophy.  NORMAL EXAM: Cecum.  NORMAL EXAM: Rectum.   Assessment Abnormal examination, see findings above.  Diagnoses: 562.10: Diverticulosis, Colon.   Events  Unplanned Interventions: No intervention was required.  Unplanned Events: There were no complications. Plans Patient Education: Patient given standard instructions for: Diverticulosis.  Scheduling/Referral: EGD, to Molly Maduro D. [name], MD, around Nov 08, 2002.  Home stool hemocults, around Nov 06, 2002.    cc. Dorthey Sawyer

## 2010-04-11 NOTE — Letter (Signed)
Summary: [facility]  [facility]   Imported By: Lester [place] 11/28/2009 07:52:32  _____________________________________________________________________  External Attachment:    Type:   Image     Comment:   External Document

## 2010-04-22 ENCOUNTER — Ambulatory Visit (INDEPENDENT_AMBULATORY_CARE_PROVIDER_SITE_OTHER): Payer: Medicare Other | Admitting: Internal Medicine

## 2010-04-22 ENCOUNTER — Encounter: Payer: Self-pay | Admitting: Internal Medicine

## 2010-04-22 ENCOUNTER — Other Ambulatory Visit: Payer: Self-pay | Admitting: Internal Medicine

## 2010-04-22 ENCOUNTER — Encounter (INDEPENDENT_AMBULATORY_CARE_PROVIDER_SITE_OTHER): Payer: Self-pay | Admitting: *Deleted

## 2010-04-22 ENCOUNTER — Other Ambulatory Visit: Payer: Medicare Other

## 2010-04-22 DIAGNOSIS — E119 Type 2 diabetes mellitus without complications: Secondary | ICD-10-CM

## 2010-04-22 DIAGNOSIS — M2550 Pain in unspecified joint: Secondary | ICD-10-CM

## 2010-04-22 DIAGNOSIS — I10 Essential (primary) hypertension: Secondary | ICD-10-CM

## 2010-04-22 LAB — BASIC METABOLIC PANEL
BUN: 14 mg/dL (ref 6–23)
Calcium: 9.7 mg/dL (ref 8.4–10.5)
Chloride: 98 mEq/L (ref 96–112)
Creatinine, Ser: 1 mg/dL (ref 0.4–1.5)
Potassium: 4.7 mEq/L (ref 3.5–5.1)

## 2010-05-01 NOTE — Assessment & Plan Note (Signed)
Summary: Pulmonary/ f/u hbp/ dm/arthralgias   Copy to:  na Primary Provider/Referring Provider:  Sandrea Hughs, MD   CC:  BP check.  History of Present Illness: 77  yowm quit smoking 1980  with HBP and chronic nonprogressive doe and PFT's  11/16/2006 FEV1 112% but ratio 57% no change ex tol .  main problem occurs walking uphill carrying buckets of beans not bothered by cough, daytime or nocturnal. no variablity on good days vs bad, every day is the same.  He has has also developed aodm II controlled with diet since 2008, no overt symptoms  10/29/07 cpx fbx 146,  hdl 26  March 02, 2008  working on wt loss thru diet but no ex.  no  polyuria/dypsia.   November 09, 2008 comprehensive eval   ov for bp check with  no tia or claudicaiton symptoms, cbg's typically < 120, no sob though avoiding ex due to heat plays golf.   February 07, 2009 3 month followup.  Pain in rt calf has resolved.  Pt c/o pain in both knees- especially at night and when walking up and down steps.  aleve helps but rarely using.  May 08, 2009 3 month followup.   Still rarely uses aleve despite new right lateral thigh/knee pain esp when lying down. no back pain, once a week maybe twice, no more, no  back pain or leg weakness.  October 08, 2009 cpx Return to office in 6 months, sooner if needed  ok to use aleve as needed with meals for joint pain   April 22, 2010 ov for bp and boderline dm f/u.  Pt denies any significant sore throat, dysphagia, itching, sneezing,  nasal congestion or excess secretions,  fever, chills, sweats, unintended wt loss, pleuritic or exertional cp, hempoptysis, change in activity tolerance  orthopnea pnd or leg swelling Pt also denies any obvious fluctuation in symptoms with weather or environmental change or other alleviating or aggravating factors.       Current Medications (verified): 1)  Aspirin 81 Mg  Tbec (Aspirin) .... Once Daily 2)  Micardis 20 Mg  Tabs (Telmisartan) .... Once  Daily 3)  Aleve 220 Mg  Tabs (Naproxen Sodium) .... As Needed 4)  Finasteride 5 Mg Tabs (Finasteride) .[name].. 1 Once Daily 5)  Tamsulosin Hcl 0.4 Mg Caps (Tamsulosin Hcl) .[name].. 1 Once Daily 6)  Prevacid 24hr 15 Mg Cpdr (Lansoprazole) .... As Needed  Allergies (verified): No Known Drug Allergies  Past History:  Past Medical History: Obesity     -  Target wt  =   171 for BMI < 26  HYPERTENSION (ICD-401.9)   - neg cardiolyte 12/06 DM (ICD-250.00)   - dx 09/2006 BENIGN PROSTATIC HYPERTROPHY, HX OF (ICD-V13.8) fu by [name] with elevated psa Hx of peptic ulcer disease DIVERTICULITIS, HX OF (ICD-V12.79................................................[name]    - colonoscopy 11/08/02 PVD (ICD-443.9)   - asym pedal pulses, asymptomatic COPD    -  PFT's 11/16/06 FEV1 3.0  (112%) with ratio 57 % and no rev, nl dlco Health Maintenance............................................................................[name]    - Td  2006,   -  Pneumovax 2004   -  CPX October 08, 2009   Vital Signs:  Patient profile:   77 year old male Weight:      185 pounds O2 Sat:      99 % on Room air Temp:     97.4 degrees F oral Pulse rate:   74 / minute BP sitting:   140 /  80  (left arm)  Vitals Entered By: Vernie Murders (April 22, 2010 10:30 AM)  O2 Flow:  Room air  Physical Exam  Additional Exam:  Robust wm, alert and approp ambulatory wm nad  wt  187 October 29, 2007   > 188 May 08, 2009 > 188 May 09, 2009 > 181 October 08, 2009 > 185 April 22, 2010  HEENT:dention ok with lower dentures, nl  turbinates, and orophanx.  Neck without JVD/Nodes/TM Lungs clear to A and P bilaterally without cough on insp or exp maneuvers, mild pectus excavatum and barrel chest RRR no s3 or murmur or increase in P2. no edema. Pulses in feet slt asym esp reduced on right  Abd soft and benign with nl excursion in the supine position.  Ext warm without calf tenderness, cyanosis clubbing  MS  nl gait, no limitations on  joint manipulation of neck, shoulders, hips, knees, but mild crepitance both knees Skin no lesions    Sodium                    139 mEq/L                   135-145   Potassium                 4.7 mEq/L                   3.5-5.1   Chloride                  98 mEq/L                    96-112   Carbon Dioxide            28 mEq/L                    19-32   Glucose              [H]  113 mg/dL                   16-10   BUN                       14 mg/dL                    9-60   Creatinine                1.0 mg/dL                   4.5-4.0   Calcium                   9.7 mg/dL                   9.8-11.9   GFR                       74.30 mL/min                >60.00  Tests: (2) Hemoglobin A1C (A1C)   Hemoglobin A1C       [H]  6.8 %                       4.6-6.5  Impression & Recommendations:  Problem # 1:  HYPERTENSION (ICD-401.9)  His updated medication list for  this problem includes:    Micardis 20 Mg Tabs (Telmisartan) ..... Once daily   ok on rx, no change needed  Orders: Est. Patient Level IV ([address])  Problem # 2:  DM (ICD-250.00)    Labs Reviewed: Creat: 0.8 (10/08/2009)    Reviewed HgBA1c results: 6.7 (10/08/2009) . 6.8 April 22, 2010   > continue dietary rx  6.4 (11/09/2008)  Problem # 3:  ARTHRALGIA UNSPECIFIED SITE (ICD-719.40)  reviewed approp use of nsaids  Orders: Est. Patient Level IV ([address])  Other Orders: TLB-BMP (Basic Metabolic Panel-BMET) (80048-METABOL) TLB-A1C / Hgb A1C (Glycohemoglobin) (83036-A1C)  Patient Instructions: 1)  CPX due after Oct 09 2010 - call sooner if needed

## 2010-07-23 NOTE — Assessment & Plan Note (Signed)
[facility]                             PULMONARY OFFICE NOTE   NAME:Peter Gross, Peter Gross                MRN:          [id number]  DATE:09/28/2006                            DOB:          26-Sep-1926    This is a primary service extended follow-up office visit.   HISTORY:  This is a very nice 77 year old white male, a former smoker  with COPD documented in 2006 with an FEV1 and FEVC ratio of only 60% but  a relatively well-maintained FEV1 and a milder elevation of FRC, normal  DLCO.  He continues to be short of breath when he tries to walk uphill  and has to slow down and take his time.  However, on a flat surface he  can walk at a pretty normal pace without difficulty and denies any  exertional chest pain, orthopnea, PND or variability, in terms of his  dyspnea with weather environmental change.   PAST MEDICAL HISTORY:  1. Atypical chest pain, status post negative Cardiolite December 2006.  2. Microcytic anemia with negative workup by gastroenterology, except      for evidence of esophagitis, chronic gastritis and diverticulosis.  3. Severe BPH followed by Dr. [name].  4. History of peptic ulcer disease with most recent endoscopy August      2004.  Negative for ulcers.  5. He underwent left shoulder surgery by Dr. Darrelyn Hillock January 2008 for      a rotator cuff, recovering well with no need for p.r.n. analgesics.   ALLERGIES:  NONE KNOWN.   MEDICATIONS:  Taken in detail on the work sheet.  Correct in the column  dated September 28, 2006.  There is confusion about his Micardis, as he says  he takes one pill a day but does not know the strength.   SOCIAL HISTORY:  He quit smoking 29 years ago.  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  He denies any alcohol  use.   FAMILY HISTORY:  Negative for heart disease, cancer, diabetes.  To his  knowledge, his mother had hypertension but lived to be in her 45s.   REVIEW OF SYSTEMS:   Taken in detail on the work sheet.  Negative, except  for nocturia x2.   PHYSICAL EXAMINATION:  GENERAL:  He is a robust, well preserved  ambulatory white man in no acute distress.  VITAL SIGNS:  Blood pressure 124/66.  HEENT:  Unremarkable.  Pharynx clear, dentition intact.  Ear canals were  clear bilaterally.  NECK:  Supple without cervical adenopathy or tenderness.  Trachea is  midline, no thyromegaly.  Carotid upstrokes are brisk, fine bruits.  Neck was supple without cervical adenopathy or tenderness.  CARDIAC:  She had very mild pectus excavatum deformity, with  hyperresonance to percussion.  There was regular rate and rhythm without  murmur, gallop and rub.  Hoover's sign was positive at the end of  inspiration.  ABDOMEN:  Soft, benign with no palpable organomegaly, masses or  tenderness.  EXTREMITIES:  Warm without calf tenderness, cyanosis, clubbing or edema.  Pedal pulses were stronger in the right posterior  tibial and the left  dorsalis pedis, in a somewhat asymmetric fashion.  GENITOURINARY/RECTAL:  Per Dr. [name].  SKIN:  Unremarkable.  NEUROLOGIC:  No focal deficits.  Sensorium nl with short and long-term  recall were excellent.  MUSCULOSKELETAL:  He has a scar over his left shoulder.  He is healing  nicely.   LABORATORY DATA:  The patient left without getting his labs done.  His  chest x-ray showed mild moderate COPD.   IMPRESSION:  1. Chronic obstructive pulmonary disease with chronic dyspnea on      exertion that really has not changed over the last year.  He might      be a candidate for Spiriva because he tends to air trap at baseline      and probably has worse air trapping with exertion, as his      respiratory arises.  We need to have him return in 6 weeks for PFTs      and then consider adding Spiriva if tolerated, in terms of his BPH,      and he gets significant benefit, in terms of improving his ability      to do desired activities.  2. Hypertension.   Well controlled without any evidence of secondary      organ damage.  However, did not obtain a BMET as recommended.  Will      make sure he gets that when he returns.  3. Flatly asymmetric pulses are consistent with peripheral vascular      disease.  I have asked him to continue with baby aspirin daily.  A      lipid profile needs to be checked when he returns, as he left      without getting blood work done, as well as a TSH looking for any      correctable cause for hyperlipidemia.  4. Benign prostatic hypertrophy with nocturia x2.  Followup by Dr.      [name], already arranged.  5. General health maintenance updated, tetanus in 2006 and pneumonia      2004.  6. History of microcytic anemia with negative workup in 2004.  We need      to recheck his CBC.     Charlaine Dalton. [name], MD, [facility]  Electronically Signed    MBW/MedQ  DD: 09/29/2006  DT: 09/29/2006  Job #: [id number]

## 2010-07-23 NOTE — Assessment & Plan Note (Signed)
[facility]                             PULMONARY OFFICE NOTE   NAME:Mcdaniel, KASSIUS BATTISTE                MRN:          [id number]  DATE:11/16/2006                            DOB:          01/13/1927    HISTORY:  This is a 77 year old white male with severe COPD who presents  for follow-up of hypertension as well.  He denies any limiting dyspnea,  fevers, chills, sweats, orthopnea, PND, or leg swelling.   PHYSICAL EXAMINATION:  GENERAL:  He is a pleasant, ambulatory white male  in no acute distress.  VITAL SIGNS:  Blood pressure 120/70 after taking his Micardis 20 mg this  morning.  HEENT:  Unremarkable.  Pharynx clear.  LUNGS:  Lung fields are clear.  HEART:  Regular rate and rhythm without murmurs, rubs, or gallops.  ABDOMEN:  Soft and benign.  EXTREMITIES:  Warm without calf tenderness, cyanosis, clubbing, or  edema.   Most recent lab data from July 28 was reviewed and indicates a fasting  of 97 with a hemoglobin A1C of 6.4.  Previously his fasting blood sugar  was reported to be 136.   IMPRESSION:  1. No evidence of significant diabetes at present, although, it would      appear that he is somewhat at risk based on previous elevated      fasting blood sugar.  2. His blood pressure is well controlled on his present minimal      regimen of 20 mg of Micardis a day.  I am going to avoid thiazide      diuretics given the issue related to blood sugar elevations as      noted above and see him back here in 3 months for a comprehensive      evaluation.     Charlaine Dalton. [name], MD, [facility]  Electronically Signed    MBW/MedQ  DD: 11/16/2006  DT: 11/17/2006  Job #: [id number]

## 2010-07-23 NOTE — Assessment & Plan Note (Signed)
[facility]                             PULMONARY OFFICE NOTE   NAME:Narain, Peter Gross                MRN:          [id number]  DATE:10/05/2006                            DOB:          Jul 19, 1926    HISTORY OF PRESENT ILLNESS:  The patient is a 77 year old white male  patient of Dr. Thurston Hole who was recently seen for a complete physical exam  on September 28, 2006.  The patient has a history of anemia, BPH, and peptic  ulcer disease, and hypertension.  The patient had complained that he had  had some slight increased shortness of breath with activity, especially  when he tries to go up an incline.  The patient has been set up for  followup to have PFTs in 6 weeks.  Lab work revealed a normal blood  count.  Potassium up at 5.3, and blood sugar at 136.  His total  cholesterol is 147 and his LDL was at 96, which was at goal.  HDL was  low at 25.  Thyroid level was normal.  His CRP was less than 1.  The  patient denies any chest pain, shortness of breath, abdominal pain,  nausea or vomiting.   PAST MEDICAL HISTORY:  Reviewed.   CURRENT MEDICATIONS:  Reviewed.   PHYSICAL EXAM:  The patient is a pleasant male in no acute distress.  He is afebrile with stable vital signs.  O2 saturation is 97% on room  air.  HEENT:  Unremarkable.  NECK:  Supple without cervical adenopathy.  No JVD.  LUNGS:  Sounds are clear.  CARDIAC:  Regular rate and rhythm.  ABDOMEN:  Soft and nontender.  EXTREMITIES:  Warm without any edema.   IMPRESSION AND PLAN:  1. Chronic dyspnea with activity without significant change from last      year.  The patient has a remote history of smoking.  The patient to      return followup for PFTs in 1 month.  May need to add in Spiriva to      see if he has any clinical response.  2. Hypertension, well-controlled.  There was a question as to what      dose of Micardis he is on.  It is suppose to be 20 mg daily.  The      patient will continue  on his present regimen and follow back up as      scheduled.  3. Hyperkalemia per lab.  Will recheck a BMET.  Suspect it could      possibly be representative of hemolysis vs effect of Micardis. Will      follow up on labs.  4. Hyperglycemia.  The patient encouraged on weight loss, decreased      sweet diet, and will check A1c to rule out new-onset diabetes.      Rubye Oaks, NP  Electronically Signed      Charlaine Dalton. [name], MD, [facility]  Electronically Signed   TP/MedQ  DD: 10/06/2006  DT: 10/07/2006  Job #: [id number]

## 2010-07-25 ENCOUNTER — Other Ambulatory Visit: Payer: Self-pay | Admitting: Internal Medicine

## 2010-07-26 NOTE — Op Note (Signed)
NAMEDOHN, STCLAIR         ACCOUNT NO.:  [id number]   MEDICAL RECORD NO.:  [id number]          PATIENT TYPE:  OIB   LOCATION:  0098                         FACILITY:  [facility]   PHYSICIAN:  Georges Lynch. Gioffre, M.D.DATE OF BIRTH:  1926/10/04   DATE OF PROCEDURE:  04/07/2006  DATE OF DISCHARGE:                               OPERATIVE REPORT   SURGEON:  Dr. Darrelyn Hillock.   ASSISTANT:  Jamelle Rushing, P.A.   PREOPERATIVE DIAGNOSES:  1. 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  2. Severe impingement syndrome, left shoulder.   POSTOPERATIVE DIAGNOSES:  1. 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  2. Severe impingement syndrome, left shoulder.   OPERATION:  1. Open acromionectomy, left shoulder.  2. Primary repair first of the hole in the rotator cuff tendon.  Note      the rotator cuff tendon had multiple small holes in it. It was very      abraded and thinned, so I then elected to give apply a Restore      tendon graft with two Multitak anchors.   DESCRIPTION OF PROCEDURE:  Under general anesthesia, routine orthopedic  prep and draping of the left shoulder was carried out.  He had 1 gram of  IV Ancef.  An incision was made over the anterior aspect of the left  shoulder, bleeders identified and cauterized.  At this time the deltoid  tendon was separated from the acromion by sharp dissection.  I split the  proximal portion of the deltoid muscle.  I then went down identified the  acromion, he had marked overgrowth and downsloping of the acromion.  I  protected the cuff with the Bennett retractor, I then utilized the  oscillating saw, did an acromionectomy and then utilized the bur to even  out the undersurface of the acromion.  Now once we established a  subacromial space, I thoroughly irrigated out the area.  I excised the  chronically inflamed subdeltoid bursa.  Following that, I went down and  noted that he had a definite hole in his tendon, the  tendon had a jagged  up.  It was very irregular, thinned out, so I did a primary repair first  on the tendon and then utilized the Restore graft as an overlay graft  over the entire defective tendon.  I utilized two Multitak anchors in  the proximal humerus and Ethibond suture to suture the tendon in place.  Once this was done, we irrigated the area and I then loosely applied  some thrombin-soaked Gelfoam up into the subacromial space.  I  reapproximated the deltoid tendon with #1 Ethibond suture.  The muscle  was closed with #0 Vicryl, subcu with #0  Vicryl, skin with metal staples.  A sterile Neosporin dressing was  placed on the shoulder. A sling was applied.  Note, prior to taking him  back to surgery when he was in the holding area, he was given an  interscalene nerve block by Dr. Okey Dupre.           ______________________________  Georges Lynch. Darrelyn Hillock, M.D.     RAG/MEDQ  D:  04/07/2006  T:  04/07/2006  Job:  [id number]

## 2010-08-14 ENCOUNTER — Other Ambulatory Visit: Payer: Self-pay | Admitting: Internal Medicine

## 2010-10-11 ENCOUNTER — Encounter: Payer: Medicare Other | Admitting: Internal Medicine

## 2010-11-01 ENCOUNTER — Other Ambulatory Visit: Payer: Medicare Other

## 2010-11-01 ENCOUNTER — Encounter: Payer: Self-pay | Admitting: Internal Medicine

## 2010-11-01 ENCOUNTER — Other Ambulatory Visit (INDEPENDENT_AMBULATORY_CARE_PROVIDER_SITE_OTHER): Payer: Medicare Other

## 2010-11-01 ENCOUNTER — Ambulatory Visit (INDEPENDENT_AMBULATORY_CARE_PROVIDER_SITE_OTHER): Payer: Medicare Other | Admitting: Internal Medicine

## 2010-11-01 VITALS — BP 106/70 | HR 67 | Temp 97.5°F | Ht 68.25 in | Wt 176.0 lb

## 2010-11-01 DIAGNOSIS — J4489 Other specified chronic obstructive pulmonary disease: Secondary | ICD-10-CM

## 2010-11-01 DIAGNOSIS — Z87898 Personal history of other specified conditions: Secondary | ICD-10-CM

## 2010-11-01 DIAGNOSIS — R0989 Other specified symptoms and signs involving the circulatory and respiratory systems: Secondary | ICD-10-CM

## 2010-11-01 DIAGNOSIS — J449 Chronic obstructive pulmonary disease, unspecified: Secondary | ICD-10-CM

## 2010-11-01 DIAGNOSIS — I739 Peripheral vascular disease, unspecified: Secondary | ICD-10-CM

## 2010-11-01 DIAGNOSIS — R0609 Other forms of dyspnea: Secondary | ICD-10-CM

## 2010-11-01 DIAGNOSIS — E119 Type 2 diabetes mellitus without complications: Secondary | ICD-10-CM

## 2010-11-01 DIAGNOSIS — K5730 Diverticulosis of large intestine without perforation or abscess without bleeding: Secondary | ICD-10-CM

## 2010-11-01 DIAGNOSIS — I10 Essential (primary) hypertension: Secondary | ICD-10-CM

## 2010-11-01 DIAGNOSIS — M2550 Pain in unspecified joint: Secondary | ICD-10-CM

## 2010-11-01 LAB — BASIC METABOLIC PANEL
CO2: 28 mEq/L (ref 19–32)
Calcium: 9.5 mg/dL (ref 8.4–10.5)
Chloride: 103 mEq/L (ref 96–112)
GFR: 81.54 mL/min (ref >60.00)
Glucose, Bld: 137 mg/dL — ABNORMAL HIGH (ref 70–99)
Potassium: 4.7 mEq/L (ref 3.5–5.1)

## 2010-11-01 LAB — HEPATIC FUNCTION PANEL
Albumin: 4.3 g/dL (ref 3.5–5.2)
Alkaline Phosphatase: 61 U/L (ref 39–117)
Bilirubin, Direct: 0.1 mg/dL (ref 0.0–0.3)
Total Bilirubin: 0.8 mg/dL (ref 0.3–1.2)
Total Protein: 7.3 g/dL (ref 6.0–8.3)

## 2010-11-01 LAB — URINALYSIS
Bilirubin Urine: NEGATIVE
Ketones, ur: NEGATIVE
Nitrite: NEGATIVE
Total Protein, Urine: NEGATIVE

## 2010-11-01 LAB — CBC WITH DIFFERENTIAL/PLATELET
Basophils Relative: 0.6 % (ref 0.0–3.0)
Lymphocytes Relative: 25.6 % (ref 12.0–46.0)
Lymphs Abs: 1.6 10*3/uL (ref 0.7–4.0)
MCHC: 33.3 g/dL (ref 30.0–36.0)
Monocytes Relative: 9.8 % (ref 3.0–12.0)
Platelets: 216 10*3/uL (ref 150.0–400.0)
RBC: 4.6 Mil/uL (ref 4.22–5.81)
RDW: 13.6 % (ref 11.5–14.6)

## 2010-11-01 LAB — MICROALBUMIN / CREATININE URINE RATIO: Microalb Creat Ratio: 0.3 mg/g (ref 0.0–30.0)

## 2010-11-01 LAB — LIPID PANEL
Cholesterol: 164 mg/dL (ref 0–200)
Total CHOL/HDL Ratio: 4
Triglycerides: 73 mg/dL (ref 0.0–149.0)

## 2010-11-01 NOTE — Progress Notes (Signed)
Quick Note:  Spoke with pt and notified of results per Dr. Wert. Pt verbalized understanding and denied any questions.  ______ 

## 2010-11-01 NOTE — Patient Instructions (Addendum)
Reduce micardis 20mg  to one half daily  We will call you with your results

## 2010-11-01 NOTE — Progress Notes (Signed)
Subjective:    Patient ID: Peter Gross, male    DOB: Apr 21, 1926, 77 y.o.   MRN: [id number]  HPI  19  yowm quit smoking 1980 with HBP and chronic nonprogressive doe and PFT's 11/16/2006 FEV1 112% but ratio 57%  And  developed aodm II controlled with diet since 2008, no overt symptoms followed by Peter Gross as primary care pt  10/29/07 cpx fbx 146, hdl 26   March 02, 2008 working on wt loss thru diet but no ex. no polyuria/dypsia.> no change rx   November 09, 2008 comprehensive eval  ov for bp check with no tia or claudicaiton symptoms, cbg's typically < 120, no sob though avoiding ex due to heat plays golf> no change rx   11/01/2010 f/u ov/Peter Gross cc cpx, occ orthostasis.  No tia or claudication,  No polyuria.  No cough  Sleeping ok without nocturnal  or early am exac of resp c/o's or need for noct saba. Pt denies any significant sore throat, dysphagia, itching, sneezing,  nasal congestion or excess/ purulent secretions,  fever, chills, sweats, unintended wt loss, pleuritic or exertional cp, hempoptysis, orthopnea pnd or leg swelling.    Also denies any obvious fluctuation of symptoms with weather or environmental changes or other aggravating or alleviating factors.      Past Medical History:  Obesity  - Target wt = 171 for BMI < 26  HYPERTENSION (ICD-401.9)  - neg cardiolyte 02/2005  DM (ICD-250.00)  - dx 7/08  BENIGN PROSTATIC HYPERTROPHY/elevated psa  ...................Peter Gross  DIVERTICULITIS, HX OF (ICD-V12.79..............................................Peter KitchenArlyce Gross  - colonoscopy 11/08/02  PVD (ICD-443.9)  - asym pedal pulses, asymptomatic  COPD  - PFT's 11/16/06 FEV1 3.0 (112%) with ratio 57 % and no rev, nl dlco  Health Maintenance............................................................................Peter Gross  - Td 2006,  - Pneumovax 2004  - CPX 11/01/10 Hx of peptic ulcer disease    Family History:  HBP mother  Dementia / ? cva Father  2 siblings healthy  No FH of  Colon Cancer   Social History:  Fish farm manager, still active  Married  3 Childern  Quit smoking 1980  No ETOH  Daily Caffeine Use: 2 daily      Review of Systems  Constitutional: Negative for fever, chills, diaphoresis, activity change, appetite change, fatigue and unexpected weight change.  HENT: Positive for hearing loss. Negative for ear pain, nosebleeds, congestion, sore throat, facial swelling, rhinorrhea, sneezing, mouth sores, trouble swallowing, neck pain, neck stiffness, dental problem, voice change, postnasal drip, sinus pressure, tinnitus and ear discharge.   Eyes: Negative for photophobia, discharge, itching and visual disturbance.  Respiratory: Positive for cough. Negative for apnea, choking, chest tightness, shortness of breath, wheezing and stridor.   Cardiovascular: Negative for chest pain, palpitations and leg swelling.  Gastrointestinal: Negative for nausea, vomiting, abdominal pain, constipation, blood in stool and abdominal distention.  Genitourinary: Positive for frequency. Negative for dysuria, urgency, hematuria, flank pain, decreased urine volume and difficulty urinating.  Musculoskeletal: Negative for myalgias, back pain, joint swelling, arthralgias and gait problem.  Skin: Negative for color change, pallor and rash.  Neurological: Positive for light-headedness. Negative for dizziness, tremors, seizures, syncope, speech difficulty, weakness, numbness and headaches.  Hematological: Negative for adenopathy. Bruises/bleeds easily.  Psychiatric/Behavioral: Negative for confusion, sleep disturbance and agitation. The patient is not nervous/anxious.        Objective:   Physical Exam   Robust wm, alert and approp ambulatory wm nad  wt 187 October 29, 2007 > 188 May 08, 2009 > 188 May 09, 2009 > 181 October 08, 2009 > 176 11/01/2010  HEENT:dention ok with lower dentures, nl turbinates, and orophanx.  Neck without JVD/Nodes/TM  Lungs clear to  A and P bilaterally without cough on insp or exp maneuvers, mild pectus excavatum and barrel chest  RRR no s3 or murmur or increase in P2. no edema. Pulses in feet slt asym esp reduced on right  Abd soft and benign with nl excursion in the supine position.  Ext warm without calf tenderness, cyanosis clubbing.  L DP strongest pulse  MS nl gait, no limitations on joint manipulation of neck, shoulders, hips, knees, but mild crepitance both knees  Skin no lesions GU/Rectal per Dr Peter Gross     Assessment & Plan:

## 2010-11-02 ENCOUNTER — Encounter: Payer: Self-pay | Admitting: Internal Medicine

## 2010-11-02 NOTE — Assessment & Plan Note (Signed)
Over treated on present rx - reduce micardis by half

## 2010-11-02 NOTE — Assessment & Plan Note (Signed)
Adequate control on present rx, reviewed  

## 2010-11-02 NOTE — Assessment & Plan Note (Signed)
Adequate control on present rx, reviewed  (diet and ex, no meds)

## 2010-11-02 NOTE — Assessment & Plan Note (Signed)
GOLD I and no  Limiting symptoms Adequate control on present rx, reviewed

## 2011-04-16 ENCOUNTER — Ambulatory Visit (INDEPENDENT_AMBULATORY_CARE_PROVIDER_SITE_OTHER): Payer: Medicare Other | Admitting: Internal Medicine

## 2011-04-16 ENCOUNTER — Encounter: Payer: Self-pay | Admitting: Internal Medicine

## 2011-04-16 DIAGNOSIS — J449 Chronic obstructive pulmonary disease, unspecified: Secondary | ICD-10-CM

## 2011-04-16 DIAGNOSIS — I10 Essential (primary) hypertension: Secondary | ICD-10-CM

## 2011-04-16 DIAGNOSIS — E119 Type 2 diabetes mellitus without complications: Secondary | ICD-10-CM

## 2011-04-16 MED ORDER — TELMISARTAN 20 MG PO TABS: ORAL_TABLET | ORAL | Status: DC

## 2011-04-16 NOTE — Patient Instructions (Signed)
Return after November 01 2011 for your physical - no change the medications

## 2011-04-16 NOTE — Assessment & Plan Note (Signed)
Adequate control on present rx, reviewed diet only rx, monitor fasting cbg's once a week and call if > 125

## 2011-04-16 NOTE — Assessment & Plan Note (Signed)
Adequate control on present rx, reviewed no need for meds as not limited

## 2011-04-16 NOTE — Progress Notes (Signed)
  Subjective:    Patient ID: JEZIAH KRETSCHMER, male    DOB: [birth year].   MRN: [id number]  HPI  45 yowm quit smoking 1980 with HBP and chronic nonprogressive doe and PFT's 11/16/2006 FEV1 112% but ratio 57%  And  developed aodm II controlled with diet since 2008, no overt symptoms followed by [name] as primary care pt     November 09, 2008 comprehensive eval  ov for bp check with no tia or claudicaiton symptoms, cbg's typically < 120, no sob though avoiding ex due to heat plays golf> no change rx   11/01/2010 f/u ov/Sonia Bromell cc cpx, occ orthostasis.  No tia or claudication,  No polyuria.  No cough rec Reduce micardis 20mg  to one half daily   04/16/2011 f/u ov/Kieran Nachtigal cc recheck bp on reduced dose of micardis, sugars consistently < 125 fasting but did not fast for labs today.  No sob/ cough/ tia/claudication. Played 26 holes of golf 2/5 no problem.  Sleeping ok without nocturnal  or early am exac of resp c/o's or need for noct saba. Pt denies any significant sore throat, dysphagia, itching, sneezing,  nasal congestion or excess/ purulent secretions,  fever, chills, sweats, unintended wt loss, pleuritic or exertional cp, hempoptysis, orthopnea pnd or leg swelling.    Also denies any obvious fluctuation of symptoms with weather or environmental changes or other aggravating or alleviating factors.      Past Medical History:  Obesity  - Target wt = 171 for BMI < 26  HYPERTENSION (ICD-401.9)  - neg cardiolyte 02/2005  DM (ICD-250.00)  - dx 09/2006, diet contolled  BENIGN PROSTATIC HYPERTROPHY/elevated psa  ...................[name]  DIVERTICULITIS, HX OF (ICD-V12.79..............................................[name]  - colonoscopy 11/08/02  PVD (ICD-443.9)  - asym pedal pulses, asymptomatic  COPD  - PFT's 11/16/06 FEV1 3.0 (112%) with ratio 57 % and no rev, nl dlco  Health Maintenance............................................................................[name]  - Td 2006,  - Pneumovax  2004  - CPX 11/01/10 Hx of peptic ulcer disease    Family History:  HBP mother  Dementia / ? cva Father  2 siblings healthy  No FH of Colon Cancer   Social History:  Retired--[employer], still active  Married  3 Childern  Quit smoking 1980  No ETOH  Daily Caffeine Use: 2 daily           Objective:   Physical Exam   Robust wm, alert and approp ambulatory wm nad  wt 187 October 29, 2007 > 188 May 08, 2009 > 188 May 09, 2009 > 181 October 08, 2009 > 176 11/01/2010 > 04/16/2011 183  HEENT:dention ok with lower dentures, nl turbinates, and orophanx.  Neck without JVD/Nodes/TM  Lungs clear to A and P bilaterally without cough on insp or exp maneuvers, mild pectus excavatum and barrel chest  RRR no s3 or murmur or increase in P2. no edema. Pulses in feet slt asym esp reduced on right  Abd soft and benign with nl excursion in the supine position.  Ext warm without calf tenderness, cyanosis clubbing.        Assessment & Plan:

## 2011-04-16 NOTE — Assessment & Plan Note (Signed)
Adequate control on present rx, reviewed  

## 2011-07-02 ENCOUNTER — Other Ambulatory Visit: Payer: Self-pay | Admitting: Dermatology

## 2011-08-26 ENCOUNTER — Telehealth: Payer: Self-pay | Admitting: Internal Medicine

## 2011-08-26 MED ORDER — TELMISARTAN 20 MG PO TABS: ORAL_TABLET | ORAL | Status: DC

## 2011-08-26 NOTE — Telephone Encounter (Signed)
Pt last seen by MW 2.6.13, told to follow up after 8.24.13 for his annual cpx.  Micardis 20mg  1/2 tab qam sent to [facility] [address]  Left detailed message on named answering machine informing pt/spouse of this.  Encouraged them to call if anything further is needed.

## 2011-09-27 ENCOUNTER — Emergency Department (INDEPENDENT_AMBULATORY_CARE_PROVIDER_SITE_OTHER)
Admission: EM | Admit: 2011-09-27 | Discharge: 2011-09-27 | Disposition: A | Discharge status: Home / Self Care | Payer: Medicare Other | Source: Home / Self Care | Attending: Emergency Medicine | Admitting: Emergency Medicine

## 2011-09-27 ENCOUNTER — Encounter (HOSPITAL_COMMUNITY): Payer: Self-pay | Admitting: Nurse Practitioner

## 2011-09-27 DIAGNOSIS — S61219A Laceration without foreign body of unspecified finger without damage to nail, initial encounter: Secondary | ICD-10-CM

## 2011-09-27 DIAGNOSIS — S61209A Unspecified open wound of unspecified finger without damage to nail, initial encounter: Secondary | ICD-10-CM

## 2011-09-27 DIAGNOSIS — Z23 Encounter for immunization: Secondary | ICD-10-CM

## 2011-09-27 MED ORDER — BACITRACIN ZINC 500 UNIT/GM EX OINT: TOPICAL_OINTMENT | Freq: Two times a day (BID) | CUTANEOUS | Status: AC

## 2011-09-27 MED ORDER — TETANUS-DIPHTH-ACELL PERTUSSIS 5-2.5-18.5 LF-MCG/0.5 IM SUSP
INTRAMUSCULAR | Status: AC
  Filled 2011-09-27: qty 0.5

## 2011-09-27 MED ORDER — TETANUS-DIPHTH-ACELL PERTUSSIS 5-2.5-18.5 LF-MCG/0.5 IM SUSP
0.5000 mL | Freq: Once | INTRAMUSCULAR | Status: AC
  Administered 2011-09-27: 0.5 mL via INTRAMUSCULAR

## 2011-09-27 MED ORDER — BACITRACIN 500 UNIT/GM EX OINT
1.0000 "application " | TOPICAL_OINTMENT | Freq: Once | CUTANEOUS | Status: AC
  Administered 2011-09-27: 1 via TOPICAL

## 2011-09-27 NOTE — ED Notes (Signed)
Pt. Has laceration on 5th digit near joint of left hand. No active bleeding. Pt. Stated injury occurred when pulling chain outside.

## 2011-09-27 NOTE — ED Notes (Signed)
Error in documentation - bacitracin applied to left hand wound.

## 2011-09-27 NOTE — ED Notes (Signed)
Patient signed Media Authorization form.  Picture was taken of laceration per Dr Rigoberto Noel request and will be loaded into patient's chart.  Media Authorization form held for scanning

## 2011-09-29 NOTE — ED Provider Notes (Addendum)
History     CSN: [id number]  Arrival date & time 09/27/11  1711   First MD Initiated Contact with Patient 09/27/11 1713      Chief Complaint  Patient presents with  . Extremity Laceration    (Consider location/radiation/quality/duration/timing/severity/associated sxs/prior treatment) HPI Comments: Patient sustained a laceration in the interdigital space on his 5 th L finger on the palmar surface. At time of presentation patient was not actively bleeding but lets him immediately after the injury which occurred while he was pulling a chain outside. Patient denies any numbness, tingling or weakness to his finger distal or proximal to the site of injury.  The history is provided by the patient.    Past Medical History  Diagnosis Date  . Obesity   . Hypertension   . Diabetes mellitus   . Benign prostatic hypertrophy   . PVD (peripheral vascular disease)   . COPD (chronic obstructive pulmonary disease)     Past Surgical History  Procedure Date  . Rotator cuff repair   . Tonsillectomy     Family History  Problem Relation Age of Onset  . Hypertension Mother   . Dementia Father     History  Substance Use Topics  . Smoking status: Former Smoker    Types: Cigarettes    Quit date: 03/10/1978  . Smokeless tobacco: Never Used   Comment: "never really inhaled"  . Alcohol Use: No      Review of Systems  Constitutional: Positive for activity change. Negative for fever and unexpected weight change.  Musculoskeletal: Negative for joint swelling.  Skin: Positive for wound.  Neurological: Negative for weakness and numbness.    Allergies  Review of patient's allergies indicates no known allergies.  Home Medications   Current Outpatient Rx  Name Route Sig Dispense Refill  . FINASTERIDE 5 MG PO TABS Oral Take 5 mg by mouth daily.      . TELMISARTAN 20 MG PO TABS  One half each am 15 tablet 3  . ASPIRIN 81 MG PO TBEC Oral Take 81 mg by mouth daily.      [name] BACITRACIN ZINC  500 UNIT/GM EX OINT Topical Apply topically 2 (two) times daily. Apply bid x 7 days 120 g 0  . LANSOPRAZOLE 15 MG PO CPDR Oral Take 15 mg by mouth daily.    [name] NAPROXEN SODIUM 220 MG PO TABS Oral Take 220 mg by mouth as needed.      [name] TAMSULOSIN HCL 0.4 MG PO CAPS Oral Take 0.4 mg by mouth daily.        BP 140/87  Pulse 92  Temp 98.3 F (36.8 C) (Oral)  SpO2 98%  Physical Exam  Nursing note and vitals reviewed. Constitutional: He appears well-developed and well-nourished.  Musculoskeletal: He exhibits tenderness. He exhibits no edema.       Hands: Neurological: He is alert.  Skin: No rash noted. No erythema.    ED Course  LACERATION REPAIR Performed by: Jovoni Borkenhagen Authorized by: Jimmie Molly Consent: Verbal consent obtained. Consent given by: patient Patient understanding: patient states understanding of the procedure being performed Patient identity confirmed: verbally with patient Body area: upper extremity Location details: left small finger Laceration length: 2.5 cm Tendon involvement: none Nerve involvement: none Vascular damage: no Local anesthetic: lidocaine 1% without epinephrine Anesthetic total: 4 ml Patient sedated: no Irrigation solution: saline Amount of cleaning: standard Debridement: none Skin closure: 4-0 Prolene Number of sutures: 3 Technique: vertical mattress Approximation: close Approximation difficulty: simple Dressing:  antibiotic ointment and gauze roll   (including critical care time)  Labs Reviewed - No data to display No results found.   1. Laceration of finger         MDM   Uncomplicated fifth finger laceration.       Jimmie Molly, MD 09/29/11 1043  Jimmie Molly, MD 09/29/11 1044

## 2011-10-07 ENCOUNTER — Encounter (HOSPITAL_COMMUNITY): Payer: Self-pay | Admitting: *Deleted

## 2011-10-07 ENCOUNTER — Emergency Department (INDEPENDENT_AMBULATORY_CARE_PROVIDER_SITE_OTHER)
Admission: EM | Admit: 2011-10-07 | Discharge: 2011-10-07 | Disposition: A | Discharge status: Home / Self Care | Payer: Medicare Other | Source: Home / Self Care | Attending: Emergency Medicine | Admitting: Emergency Medicine

## 2011-10-07 DIAGNOSIS — Z4802 Encounter for removal of sutures: Secondary | ICD-10-CM

## 2011-10-07 NOTE — ED Provider Notes (Signed)
History     CSN: [id number]  Arrival date & time 10/07/11  4098   First MD Initiated Contact with Patient 10/07/11 1856      Chief Complaint  Patient presents with  . Suture / Staple Removal    (Consider location/radiation/quality/duration/timing/severity/associated sxs/prior treatment) HPI Comments: Patient presents tonight to urgent care to have sutures removed the workplace and this interdigital space on his hand. Patient describe a little bit of ten to the area with movement and touch but no drainage, redness or swelling. In fact he describes that the pain was present somewhat more yesterday and actually at this point feels much better.  Patient is a 78 y.o. male presenting with suture removal. The history is provided by the patient.  Suture / Staple Removal  The sutures were placed 7 to 10 days ago. Treatments since wound repair include antibiotic ointment use and a wound recheck. There has been no drainage from the wound. There is no redness present. There is no swelling present. The pain has improved. He has no difficulty moving the affected extremity or digit.    Past Medical History  Diagnosis Date  . Obesity   . Hypertension   . Diabetes mellitus   . Benign prostatic hypertrophy   . PVD (peripheral vascular disease)   . COPD (chronic obstructive pulmonary disease)     Past Surgical History  Procedure Date  . Rotator cuff repair   . Tonsillectomy     Family History  Problem Relation Age of Onset  . Hypertension Mother   . Dementia Father     History  Substance Use Topics  . Smoking status: Former Smoker    Types: Cigarettes    Quit date: 03/10/1978  . Smokeless tobacco: Never Used   Comment: "never really inhaled"  . Alcohol Use: No      Review of Systems  Constitutional: Negative for fever and activity change.  Skin: Positive for wound. Negative for color change, pallor and rash.  Neurological: Negative for weakness and numbness.    Allergies    Review of patient's allergies indicates no known allergies.  Home Medications   Current Outpatient Rx  Name Route Sig Dispense Refill  . ASPIRIN 81 MG PO TBEC Oral Take 81 mg by mouth daily.      [name] BACITRACIN ZINC 500 UNIT/GM EX OINT Topical Apply topically 2 (two) times daily. Apply bid x 7 days 120 g 0  . FINASTERIDE 5 MG PO TABS Oral Take 5 mg by mouth daily.      [name] LANSOPRAZOLE 15 MG PO CPDR Oral Take 15 mg by mouth daily.    [name] NAPROXEN SODIUM 220 MG PO TABS Oral Take 220 mg by mouth as needed.      [name] TAMSULOSIN HCL 0.4 MG PO CAPS Oral Take 0.4 mg by mouth daily.      . TELMISARTAN 20 MG PO TABS  One half each am 15 tablet 3    BP 172/76  Pulse 72  Temp 98.6 F (37 C) (Oral)  Resp 16  SpO2 100%  Physical Exam  Musculoskeletal: He exhibits tenderness.       Hands: Neurological: He is alert.  Skin: No rash noted. No erythema.    ED Course  Procedures (including critical care time)  Labs Reviewed - No data to display No results found.   No diagnosis found.    MDM  Suture removal in left interdigital space. Full range of motion of his finger.  Jimmie Molly, MD 10/07/11 2047

## 2011-10-07 NOTE — ED Notes (Signed)
Pt   Here  For  Suture  Removal      Sutures  Placed  10  Days  Ago

## 2011-10-07 NOTE — ED Notes (Signed)
All  Sutures  Removed         Wound  Care    Done

## 2011-10-28 ENCOUNTER — Encounter: Payer: Medicare Other | Admitting: Internal Medicine

## 2011-10-28 NOTE — Progress Notes (Signed)
 This encounter was created in error - please disregard.

## 2011-11-26 ENCOUNTER — Encounter: Payer: Medicare Other | Admitting: Internal Medicine

## 2011-12-18 ENCOUNTER — Ambulatory Visit (INDEPENDENT_AMBULATORY_CARE_PROVIDER_SITE_OTHER)
Admission: RE | Admit: 2011-12-18 | Discharge: 2011-12-18 | Disposition: A | Discharge status: Home / Self Care | Payer: Medicare Other | Source: Ambulatory Visit | Attending: Internal Medicine | Admitting: Internal Medicine

## 2011-12-18 ENCOUNTER — Encounter: Payer: Self-pay | Admitting: Internal Medicine

## 2011-12-18 ENCOUNTER — Ambulatory Visit (INDEPENDENT_AMBULATORY_CARE_PROVIDER_SITE_OTHER): Payer: Medicare Other | Admitting: Internal Medicine

## 2011-12-18 ENCOUNTER — Other Ambulatory Visit: Payer: Self-pay | Admitting: Internal Medicine

## 2011-12-18 ENCOUNTER — Other Ambulatory Visit (INDEPENDENT_AMBULATORY_CARE_PROVIDER_SITE_OTHER): Payer: Medicare Other

## 2011-12-18 VITALS — BP 132/78 | HR 68 | Temp 97.7°F | Ht 68.0 in | Wt 176.0 lb

## 2011-12-18 DIAGNOSIS — J449 Chronic obstructive pulmonary disease, unspecified: Secondary | ICD-10-CM

## 2011-12-18 DIAGNOSIS — I739 Peripheral vascular disease, unspecified: Secondary | ICD-10-CM

## 2011-12-18 DIAGNOSIS — E119 Type 2 diabetes mellitus without complications: Secondary | ICD-10-CM

## 2011-12-18 DIAGNOSIS — I10 Essential (primary) hypertension: Secondary | ICD-10-CM

## 2011-12-18 DIAGNOSIS — E785 Hyperlipidemia, unspecified: Secondary | ICD-10-CM | POA: Insufficient documentation

## 2011-12-18 LAB — CBC WITH DIFFERENTIAL/PLATELET
Basophils Absolute: 0 10*3/uL (ref 0.0–0.1)
HCT: 46.6 % (ref 39.0–52.0)
Hemoglobin: 15.6 g/dL (ref 13.0–17.0)
Lymphs Abs: 1.6 10*3/uL (ref 0.7–4.0)
MCHC: 33.5 g/dL (ref 30.0–36.0)
MCV: 94.7 fl (ref 78.0–100.0)
Monocytes Relative: 9.2 % (ref 3.0–12.0)
Neutro Abs: 3.6 10*3/uL (ref 1.4–7.7)
RDW: 13.3 % (ref 11.5–14.6)

## 2011-12-18 LAB — URINALYSIS
Ketones, ur: NEGATIVE
Urine Glucose: NEGATIVE
Urobilinogen, UA: 0.2 (ref 0.0–1.0)

## 2011-12-18 LAB — LIPID PANEL
LDL Cholesterol: 96 mg/dL (ref 0–99)
Total CHOL/HDL Ratio: 5

## 2011-12-18 LAB — TSH: TSH: 1.04 u[IU]/mL (ref 0.35–5.50)

## 2011-12-18 LAB — BASIC METABOLIC PANEL
CO2: 29 mEq/L (ref 19–32)
Calcium: 9.8 mg/dL (ref 8.4–10.5)
Creatinine, Ser: 0.9 mg/dL (ref 0.4–1.5)

## 2011-12-18 LAB — HEMOGLOBIN A1C: Hgb A1c MFr Bld: 6.4 % (ref 4.6–6.5)

## 2011-12-18 MED ORDER — SILDENAFIL CITRATE 50 MG PO TABS: 50.0000 mg | ORAL_TABLET | Freq: Every day | ORAL | Status: DC | PRN

## 2011-12-18 NOTE — Assessment & Plan Note (Signed)
-   PFT's 11/16/06 FEV1 3.0 (112%) with ratio 57 % and no rev, nl dlco   GOLD I Adequate control on present rx, reviewed

## 2011-12-18 NOTE — Assessment & Plan Note (Signed)
-   Target LDL < 70 given PVD  Lab Results  Component Value Date   LDLCALC 96 12/18/2011     Adequate control on present rx, reviewed

## 2011-12-18 NOTE — Progress Notes (Signed)
Quick Note:  Spoke with pt and notified of results per Dr. Wert. Pt verbalized understanding and denied any questions.  ______ 

## 2011-12-18 NOTE — Patient Instructions (Addendum)
Please remember to go to the lab and x-ray department downstairs for your tests - we will call you with the results when they are available.    See Tammy NP for wax removal if bothers you after trying the over the counter kit  Please schedule a follow up visit in 6 months but call sooner if needed

## 2011-12-18 NOTE — Assessment & Plan Note (Signed)
Adequate control on present rx, reviewed > diet

## 2011-12-18 NOTE — Assessment & Plan Note (Signed)
-   Dorsal pedis on L only strong pulse on exam 11/01/10 but no assoc claudication, no tia's or evidence IHD > control risk factors

## 2011-12-18 NOTE — Assessment & Plan Note (Signed)
Adequate control on present rx, reviewed  

## 2011-12-18 NOTE — Progress Notes (Signed)
Subjective:    Patient ID: Peter Gross, male    DOB: [birth year].   MRN: [id number]  HPI  75 yowm quit smoking 1980 with HBP and chronic nonprogressive doe and PFT's 11/16/2006 FEV1 112% but ratio 57%  And  developed aodm II controlled with diet since 2008, no overt symptoms followed by Peter Gross as primary care pt     November 09, 2008 comprehensive eval  ov for bp check with no tia or claudicaiton symptoms, cbg's typically < 120, no sob though avoiding ex due to heat plays golf> no change rx   11/01/2010 f/u ov/Peter Gross cc cpx, occ orthostasis.  No tia or claudication,  No polyuria.  No cough rec Reduce micardis 20mg  to one half daily   04/16/2011 f/u ov/Peter Gross cc recheck bp on reduced dose of micardis, sugars consistently < 125 fasting but did not fast for labs today.  No sob/ cough/ tia/claudication. Played 26 holes of golf 2/5 no problem. rec No change rx  11/01/11 ov/ f/u hbp/ Peter Gross rec Reduce micardis 20mg  to one half daily   12/18/2011 f/u ov/Peter Gross yearly eval mulitple problems COPD Doe x hills only, no cough, no variability or assoc wheeze/ chest tightness/ HBP PVD, no claudication Borderline DM   Sleeping ok without nocturnal  or early am exacerbation  of respiratory  c/o's or need for noct saba. Also denies any obvious fluctuation of symptoms with weather or environmental changes or other aggravating or alleviating factors except as outlined above   ROS  The following are not active complaints unless bolded sore throat, dysphagia, dental problems, itching, sneezing,  nasal congestion or excess/ purulent secretions, ear ache,   fever, chills, sweats, unintended wt loss, pleuritic or exertional cp, hemoptysis,  orthopnea pnd or leg swelling, presyncope, palpitations, heartburn, abdominal pain, anorexia, nausea, vomiting, diarrhea  or change in bowel or urinary habits, change in stools or urine, dysuria,hematuria,  rash, arthralgias, visual complaints, headache, numbness weakness  or ataxia or problems with walking or coordination,  change in mood/affect or memory.      Past Medical History:  Obesity  - Target wt = 171 for BMI < 26  HYPERTENSION (ICD-401.9)  - neg cardiolyte 02/2005  DM (ICD-250.00)  - dx 09/2006, diet contolled  BENIGN PROSTATIC HYPERTROPHY/elevated psa  ...................[name]  Hx of peptic ulcer disease  DIVERTICULITIS, HX OF (ICD-V12.79..............................................[name]  - colonoscopy 11/08/02  PVD (ICD-443.9)  - asym pedal pulses, asymptomatic  COPD  - PFT's 11/16/06 FEV1 3.0 (112%) with ratio 57 % and no rev, nl dlco  Health Maintenance............................................................................[name]  - Td 2006,  - Pneumovax 2004  - CPX 12/18/2011     Family History:  HBP mother  Dementia / ? cva Father  2 siblings healthy  No FH of Colon Cancer   Social History:  Retired--[employer], still active  Married  3 Childern  Quit smoking 1980  No ETOH  Daily Caffeine Use: 2 daily           Objective:   Physical Exam   Robust wm, alert and approp ambulatory wm nad  wt 187 October 29, 2007 > 188 May 08, 2009 > 188 May 09, 2009 > 181 October 08, 2009 > 176 11/01/2010 > 04/16/2011 183 >  12/18/2011  176 HEENT:dention ok with lower dentures, upper caps nl turbinates, and orophanx.  Neck without JVD/Nodes/TM  Ears  R wax , L clear  Lungs clear to A and P bilaterally without cough on insp or exp maneuvers, mild pectus  excavatum and barrel chest  RRR no s3 or murmur or increase in P2. no edema. Pulses in feet slt asym esp reduced on right  Abd soft and benign with nl excursion in the supine position.  Ext warm without calf tenderness, cyanosis clubbing.  Neuro: intact sensorium, no motor or cerebellar def or path reflexes MS Nl gait, no deformities or joint restrictions  CXR  12/18/2011 :  No evidence of acute cardiopulmonary disease.       Assessment & Plan:

## 2012-06-17 ENCOUNTER — Ambulatory Visit: Payer: Medicare Other | Admitting: Internal Medicine

## 2012-06-23 ENCOUNTER — Encounter: Payer: Self-pay | Admitting: Internal Medicine

## 2012-06-23 ENCOUNTER — Ambulatory Visit (INDEPENDENT_AMBULATORY_CARE_PROVIDER_SITE_OTHER): Payer: Medicare Other | Admitting: Internal Medicine

## 2012-06-23 VITALS — BP 106/60 | HR 70 | Temp 98.0°F | Ht 69.5 in | Wt 180.8 lb

## 2012-06-23 DIAGNOSIS — E785 Hyperlipidemia, unspecified: Secondary | ICD-10-CM

## 2012-06-23 DIAGNOSIS — R12 Heartburn: Secondary | ICD-10-CM | POA: Insufficient documentation

## 2012-06-23 DIAGNOSIS — I10 Essential (primary) hypertension: Secondary | ICD-10-CM

## 2012-06-23 NOTE — Assessment & Plan Note (Signed)
Adequate control on present rx, reviewed  

## 2012-06-23 NOTE — Assessment & Plan Note (Signed)
-   Target LDL < 70 given PVD  Lab Results  Component Value Date   CHOL 155 12/18/2011   HDL 33.20* 12/18/2011   LDLCALC 96 12/18/2011   TRIG 131.0 12/18/2011   CHOLHDL 5 12/18/2011    Adequate control on present rx, reviewed

## 2012-06-23 NOTE — Patient Instructions (Addendum)
Either pepcid ac 20 mg as needed or use prevacid 15-30 mg 30-60 min before supper   Only take the aleve with meals   GERD (heart burn) can be treated with medication, but also with lifestyle changes including avoidance of late meals, excessive alcohol, smoking cessation, and avoid fatty foods, chocolate, peppermint, colas, red wine, and acidic juices such as orange juice.  NO MINT OR MENTHOL PRODUCTS SO NO COUGH DROPS  USE SUGARLESS CANDY INSTEAD (jolley ranchers or Stover's)  NO OIL BASED VITAMINS - use powdered substitutes.  If not better call referral to GI  Due back for CPX after 12/17/12

## 2012-06-23 NOTE — Progress Notes (Signed)
Subjective:    Patient ID: Peter Gross, male    DOB: 1927/01/16.   MRN: [id number]  HPI  84 yowm quit smoking 1980 with HBP and chronic nonprogressive doe and PFT's 11/16/2006 FEV1 112% but ratio 57%  And  developed aodm II controlled with diet since 2008, no overt symptoms followed by [name] as primary care pt     November 09, 2008 comprehensive eval  ov for bp check with no tia or claudicaiton symptoms, cbg's typically < 120, no sob though avoiding ex due to heat plays golf> no change rx   11/01/2010 f/u ov/Darlys Buis cc cpx, occ orthostasis.  No tia or claudication,  No polyuria.  No cough rec Reduce micardis 20mg  to one half daily   04/16/2011 f/u ov/Artasia Thang cc recheck bp on reduced dose of micardis, sugars consistently < 125 fasting but did not fast for labs today.  No sob/ cough/ tia/claudication. Played 26 holes of golf 2/5 no problem. rec No change rx  11/01/11 ov/ f/u hbp/ Lilja Soland rec Reduce micardis 20mg  to one half daily   12/18/2011 f/u ov/[place] Nabor yearly eval mulitple problems COPD Doe x hills only, no cough, no variability or assoc wheeze/ chest tightness/ HBP PVD, no claudication Borderline DM rec No change rx   06/23/2012 f/u ov/Trish Mancinelli re muliple dx's as above plus new HB Chief Complaint  Patient presents with  . Follow-up    Pt c/o heart burn x 3 months, worse at night and some during the day.   symptoms mod severe, ? Started p taking aleve in pms after supper for arthritis, not taking prevacid approp, no dysphagia, no ex cp or sob/ diaphoresis, symptoms 4-5/7 nights a week    Sleeping ok without nocturnal  or early am exacerbation  of respiratory  c/o's or need for noct saba. Also denies any obvious fluctuation of symptoms with weather or environmental changes or other aggravating or alleviating factors except as outlined above   ROS  The following are not active complaints unless bolded sore throat, dysphagia, dental problems, itching, sneezing,  nasal congestion or  excess/ purulent secretions, ear ache,   fever, chills, sweats, unintended wt loss, pleuritic or exertional cp, hemoptysis,  orthopnea pnd or leg swelling, presyncope, palpitations, heartburn, abdominal pain, anorexia, nausea, vomiting, diarrhea  or change in bowel or urinary habits, change in stools or urine, dysuria,hematuria,  rash, arthralgias, visual complaints, headache, numbness weakness or ataxia or problems with walking or coordination,  change in mood/affect or memory.      Past Medical History:  Obesity  - Target wt = 171 for BMI < 26  HYPERTENSION (ICD-401.9)  - neg cardiolyte 02/2005  DM (ICD-250.00)  - dx 09/2006, diet contolled  BENIGN PROSTATIC HYPERTROPHY/elevated psa  ...................[name]  Hx of peptic ulcer disease  DIVERTICULITIS, HX OF (ICD-V12.79..............................................[name]  - colonoscopy 11/08/02  PVD (ICD-443.9)  - asym pedal pulses, asymptomatic  COPD  - PFT's 11/16/06 FEV1 3.0 (112%) with ratio 57 % and no rev, nl dlco  Health Maintenance............................................................................[name]  - Td 2006 - Pneumovax 2004  - CPX 12/18/2011     Family History:  HBP mother  Dementia / ? cva Father  2 siblings healthy  No FH of Colon Cancer   Social History:  Fish farm manager, still active  Married  3 Childern  Quit smoking 1980  No ETOH  Daily Caffeine Use: 2 daily           Objective:   Physical Exam   Robust wm, alert  and approp ambulatory wm nad  wt 187 October 29, 2007 > 188 May 08, 2009 > 188 May 09, 2009 > 181 October 08, 2009 > 176 11/01/2010 > 04/16/2011 183 >  12/18/2011  176 > 06/23/2012  180 HEENT:dention ok with lower dentures, upper caps nl turbinates, and orophanx.  Neck without JVD/Nodes/TM  Lungs clear to A and P bilaterally without cough on insp or exp maneuvers, mild pectus excavatum and barrel chest  RRR no s3 or murmur or increase in P2. no edema. Pulses  in feet slt asym esp reduced on right  Abd soft and benign with nl excursion in the supine position.  Ext warm without calf tenderness, cyanosis clubbing.  Neuro: intact sensorium, no motor or cerebellar def or path reflexes MS Nl gait, no deformities or joint restrictions  CXR  12/18/2011 :  No evidence of acute cardiopulmonary disease.       Assessment & Plan:

## 2012-06-23 NOTE — Assessment & Plan Note (Signed)
-   onset p rx with aleve 2014  Classic symptoms failed to respond to prevacid taken at inappropriate times  rec only take aleve with meals, See instructions for specific recommendations which were reviewed directly with the patient who was given a copy with highlighter outlining the key components. Will refer to gi next if not better

## 2012-09-27 ENCOUNTER — Other Ambulatory Visit: Payer: Self-pay | Admitting: Family Medicine

## 2012-09-27 DIAGNOSIS — M25562 Pain in left knee: Secondary | ICD-10-CM

## 2012-09-28 ENCOUNTER — Ambulatory Visit
Admission: RE | Admit: 2012-09-28 | Discharge: 2012-09-28 | Disposition: A | Discharge status: Home / Self Care | Payer: Medicare Other | Source: Ambulatory Visit | Attending: Family Medicine | Admitting: Family Medicine

## 2012-09-28 DIAGNOSIS — M25562 Pain in left knee: Secondary | ICD-10-CM

## 2012-09-29 ENCOUNTER — Encounter (HOSPITAL_COMMUNITY): Payer: Self-pay | Admitting: Pharmacy Technician

## 2012-09-29 ENCOUNTER — Other Ambulatory Visit: Payer: Self-pay | Admitting: Orthopedic Surgery

## 2012-09-30 ENCOUNTER — Encounter (HOSPITAL_COMMUNITY): Payer: Self-pay | Admitting: *Deleted

## 2012-09-30 NOTE — Progress Notes (Signed)
Pt denies SOB, chest pain, and being under the care of a cardiologist. Pt denies having an echo and cardiac cath. Pt made aware to stop taking Aspirin and herbal medications. Do not take any NSAIDs ie: Ibuprofen, Advil, Naproxen or any medication containing Aspirin.

## 2012-10-01 ENCOUNTER — Ambulatory Visit (HOSPITAL_COMMUNITY): Payer: Medicare Other | Admitting: Anesthesiology

## 2012-10-01 ENCOUNTER — Encounter (HOSPITAL_COMMUNITY)
Admission: RE | Disposition: A | Discharge status: Home / Self Care | Payer: Self-pay | Source: Ambulatory Visit | Attending: Orthopedic Surgery

## 2012-10-01 ENCOUNTER — Encounter (HOSPITAL_COMMUNITY): Payer: Self-pay | Admitting: Anesthesiology

## 2012-10-01 ENCOUNTER — Ambulatory Visit (HOSPITAL_COMMUNITY)
Admission: RE | Admit: 2012-10-01 | Discharge: 2012-10-01 | Disposition: A | Discharge status: Home / Self Care | Payer: Medicare Other | Source: Ambulatory Visit | Attending: Orthopedic Surgery | Admitting: Orthopedic Surgery

## 2012-10-01 ENCOUNTER — Encounter: Payer: Self-pay | Admitting: Internal Medicine

## 2012-10-01 DIAGNOSIS — I10 Essential (primary) hypertension: Secondary | ICD-10-CM | POA: Insufficient documentation

## 2012-10-01 DIAGNOSIS — M1710 Unilateral primary osteoarthritis, unspecified knee: Secondary | ICD-10-CM | POA: Insufficient documentation

## 2012-10-01 DIAGNOSIS — Y929 Unspecified place or not applicable: Secondary | ICD-10-CM | POA: Insufficient documentation

## 2012-10-01 DIAGNOSIS — J449 Chronic obstructive pulmonary disease, unspecified: Secondary | ICD-10-CM | POA: Insufficient documentation

## 2012-10-01 DIAGNOSIS — E119 Type 2 diabetes mellitus without complications: Secondary | ICD-10-CM | POA: Insufficient documentation

## 2012-10-01 DIAGNOSIS — Z7982 Long term (current) use of aspirin: Secondary | ICD-10-CM | POA: Insufficient documentation

## 2012-10-01 DIAGNOSIS — Z79899 Other long term (current) drug therapy: Secondary | ICD-10-CM | POA: Insufficient documentation

## 2012-10-01 DIAGNOSIS — K219 Gastro-esophageal reflux disease without esophagitis: Secondary | ICD-10-CM | POA: Insufficient documentation

## 2012-10-01 DIAGNOSIS — W108XXA Fall (on) (from) other stairs and steps, initial encounter: Secondary | ICD-10-CM | POA: Insufficient documentation

## 2012-10-01 DIAGNOSIS — IMO0002 Reserved for concepts with insufficient information to code with codable children: Secondary | ICD-10-CM | POA: Insufficient documentation

## 2012-10-01 DIAGNOSIS — I739 Peripheral vascular disease, unspecified: Secondary | ICD-10-CM | POA: Insufficient documentation

## 2012-10-01 DIAGNOSIS — S86819A Strain of other muscle(s) and tendon(s) at lower leg level, unspecified leg, initial encounter: Secondary | ICD-10-CM | POA: Insufficient documentation

## 2012-10-01 DIAGNOSIS — J4489 Other specified chronic obstructive pulmonary disease: Secondary | ICD-10-CM | POA: Insufficient documentation

## 2012-10-01 DIAGNOSIS — Z87891 Personal history of nicotine dependence: Secondary | ICD-10-CM | POA: Insufficient documentation

## 2012-10-01 DIAGNOSIS — E669 Obesity, unspecified: Secondary | ICD-10-CM | POA: Insufficient documentation

## 2012-10-01 DIAGNOSIS — S76112A Strain of left quadriceps muscle, fascia and tendon, initial encounter: Secondary | ICD-10-CM

## 2012-10-01 DIAGNOSIS — N400 Enlarged prostate without lower urinary tract symptoms: Secondary | ICD-10-CM | POA: Insufficient documentation

## 2012-10-01 HISTORY — DX: Gastro-esophageal reflux disease without esophagitis: K21.9

## 2012-10-01 HISTORY — DX: Unspecified osteoarthritis, unspecified site: M19.90

## 2012-10-01 HISTORY — PX: QUADRICEPS TENDON REPAIR: SHX756

## 2012-10-01 HISTORY — DX: Unspecified cataract: H26.9

## 2012-10-01 LAB — CBC
Hemoglobin: 14.5 g/dL (ref 13.0–17.0)
MCH: 31.8 pg (ref 26.0–34.0)
MCHC: 34.9 g/dL (ref 30.0–36.0)
MCV: 91.2 fL (ref 78.0–100.0)
Platelets: 226 10*3/uL (ref 150–400)
RBC: 4.56 MIL/uL (ref 4.22–5.81)

## 2012-10-01 LAB — BASIC METABOLIC PANEL
BUN: 15 mg/dL (ref 6–23)
CO2: 27 mEq/L (ref 19–32)
Calcium: 9.7 mg/dL (ref 8.4–10.5)
GFR calc non Af Amer: 80 mL/min — ABNORMAL LOW (ref >90)
Glucose, Bld: 112 mg/dL — ABNORMAL HIGH (ref 70–99)

## 2012-10-01 LAB — GLUCOSE, CAPILLARY: Glucose-Capillary: 96 mg/dL (ref 70–99)

## 2012-10-01 SURGERY — REPAIR, TENDON, QUADRICEPS
Anesthesia: Spinal | Site: Leg Upper | Laterality: Left | Wound class: Clean

## 2012-10-01 MED ORDER — MIDAZOLAM HCL 5 MG/5ML IJ SOLN
INTRAMUSCULAR | Status: DC | PRN
  Administered 2012-10-01 (×2): 0.5 mg via INTRAVENOUS
  Administered 2012-10-01: 1 mg via INTRAVENOUS

## 2012-10-01 MED ORDER — CHLORHEXIDINE GLUCONATE 4 % EX LIQD: 60.0000 mL | Freq: Once | CUTANEOUS | Status: DC

## 2012-10-01 MED ORDER — PHENYLEPHRINE HCL 10 MG/ML IJ SOLN
10.0000 mg | INTRAVENOUS | Status: DC | PRN
  Administered 2012-10-01: 10 ug/min via INTRAVENOUS

## 2012-10-01 MED ORDER — LACTATED RINGERS IV SOLN
INTRAVENOUS | Status: DC
  Administered 2012-10-01: 14:00:00 via INTRAVENOUS

## 2012-10-01 MED ORDER — FENTANYL CITRATE 0.05 MG/ML IJ SOLN: 25.0000 ug | INTRAMUSCULAR | Status: DC | PRN

## 2012-10-01 MED ORDER — BUPIVACAINE HCL (PF) 0.25 % IJ SOLN
INTRAMUSCULAR | Status: AC
  Filled 2012-10-01: qty 30

## 2012-10-01 MED ORDER — 0.9 % SODIUM CHLORIDE (POUR BTL) OPTIME
TOPICAL | Status: DC | PRN
  Administered 2012-10-01: 1000 mL

## 2012-10-01 MED ORDER — HYDROCODONE-ACETAMINOPHEN 5-325 MG PO TABS
ORAL_TABLET | ORAL | Status: AC
  Filled 2012-10-01: qty 2

## 2012-10-01 MED ORDER — HYDROMORPHONE HCL PF 1 MG/ML IJ SOLN
INTRAMUSCULAR | Status: AC
  Filled 2012-10-01: qty 1

## 2012-10-01 MED ORDER — FENTANYL CITRATE 0.05 MG/ML IJ SOLN
INTRAMUSCULAR | Status: DC | PRN
  Administered 2012-10-01: 50 ug via INTRAVENOUS
  Administered 2012-10-01 (×2): 25 ug via INTRAVENOUS
  Administered 2012-10-01: 50 ug via INTRAVENOUS

## 2012-10-01 MED ORDER — METHOCARBAMOL 500 MG PO TABS: 500.0000 mg | ORAL_TABLET | Freq: Four times a day (QID) | ORAL | Status: DC

## 2012-10-01 MED ORDER — PROPOFOL 10 MG/ML IV BOLUS
INTRAVENOUS | Status: DC | PRN
  Administered 2012-10-01: 20 mg via INTRAVENOUS
  Administered 2012-10-01: 100 mg via INTRAVENOUS
  Administered 2012-10-01: 20 mg via INTRAVENOUS

## 2012-10-01 MED ORDER — CLONIDINE HCL (ANALGESIA) 100 MCG/ML EP SOLN
EPIDURAL | Status: DC | PRN
  Administered 2012-10-01: 1 mL

## 2012-10-01 MED ORDER — HYDROCODONE-ACETAMINOPHEN 5-325 MG PO TABS: 1.0000 | ORAL_TABLET | Freq: Four times a day (QID) | ORAL | Status: DC | PRN

## 2012-10-01 MED ORDER — CLONIDINE HCL (ANALGESIA) 100 MCG/ML EP SOLN
150.0000 ug | Freq: Once | EPIDURAL | Status: DC
  Filled 2012-10-01 (×2): qty 1.5

## 2012-10-01 MED ORDER — MORPHINE SULFATE 4 MG/ML IJ SOLN
INTRAMUSCULAR | Status: AC
  Filled 2012-10-01: qty 1

## 2012-10-01 MED ORDER — HYDROCODONE-ACETAMINOPHEN 5-325 MG PO TABS
ORAL_TABLET | ORAL | Status: AC
  Filled 2012-10-01: qty 1

## 2012-10-01 MED ORDER — ASPIRIN EC 325 MG PO TBEC: 325.0000 mg | DELAYED_RELEASE_TABLET | Freq: Every day | ORAL | Status: DC

## 2012-10-01 MED ORDER — ONDANSETRON HCL 4 MG/2ML IJ SOLN
INTRAMUSCULAR | Status: AC
  Filled 2012-10-01: qty 2

## 2012-10-01 MED ORDER — CEFAZOLIN SODIUM-DEXTROSE 2-3 GM-% IV SOLR
2.0000 g | INTRAVENOUS | Status: AC
  Administered 2012-10-01: 2 g via INTRAVENOUS
  Filled 2012-10-01: qty 50

## 2012-10-01 MED ORDER — LACTATED RINGERS IV SOLN
INTRAVENOUS | Status: DC | PRN
  Administered 2012-10-01 (×2): via INTRAVENOUS

## 2012-10-01 MED ORDER — MORPHINE SULFATE 4 MG/ML IJ SOLN
INTRAMUSCULAR | Status: DC | PRN
  Administered 2012-10-01: 4 mg

## 2012-10-01 MED ORDER — BUPIVACAINE IN DEXTROSE 0.75-8.25 % IT SOLN
INTRATHECAL | Status: DC | PRN
  Administered 2012-10-01: 10 mg via INTRATHECAL

## 2012-10-01 MED ORDER — HYDROCODONE-ACETAMINOPHEN 5-325 MG PO TABS
ORAL_TABLET | ORAL | Status: AC
  Administered 2012-10-01: 1
  Filled 2012-10-01: qty 1

## 2012-10-01 MED ORDER — BUPIVACAINE HCL (PF) 0.25 % IJ SOLN
INTRAMUSCULAR | Status: DC | PRN
  Administered 2012-10-01: 30 mL

## 2012-10-01 MED ORDER — PHENYLEPHRINE HCL 10 MG/ML IJ SOLN
30.0000 ug/min | INTRAMUSCULAR | Status: DC
  Filled 2012-10-01 (×2): qty 1

## 2012-10-01 MED ORDER — PROMETHAZINE HCL 25 MG/ML IJ SOLN: 6.2500 mg | INTRAMUSCULAR | Status: DC | PRN

## 2012-10-01 SURGICAL SUPPLY — 70 items
BANDAGE ELASTIC 4 VELCRO ST LF (GAUZE/BANDAGES/DRESSINGS) IMPLANT
BANDAGE ELASTIC 6 VELCRO ST LF (GAUZE/BANDAGES/DRESSINGS) ×4 IMPLANT
BANDAGE GAUZE ELAST BULKY 4 IN (GAUZE/BANDAGES/DRESSINGS) ×2 IMPLANT
BIT DRILL 5/64X5 DISP (BIT) IMPLANT
BLADE SURG 10 STRL SS (BLADE) ×2 IMPLANT
BNDG COHESIVE 4X5 TAN STRL (GAUZE/BANDAGES/DRESSINGS) ×2 IMPLANT
CLOTH BEACON ORANGE TIMEOUT ST (SAFETY) ×2 IMPLANT
COVER MAYO STAND STRL (DRAPES) ×2 IMPLANT
COVER SURGICAL LIGHT HANDLE (MISCELLANEOUS) ×2 IMPLANT
CUFF TOURNIQUET SINGLE 24IN (TOURNIQUET CUFF) IMPLANT
CUFF TOURNIQUET SINGLE 34IN LL (TOURNIQUET CUFF) ×2 IMPLANT
CUFF TOURNIQUET SINGLE 44IN (TOURNIQUET CUFF) IMPLANT
DRAPE INCISE IOBAN 66X45 STRL (DRAPES) ×2 IMPLANT
DRAPE U-SHAPE 47X51 STRL (DRAPES) ×2 IMPLANT
DRILL BIT 7/64X5 (BIT) IMPLANT
DRSG PAD ABDOMINAL 8X10 ST (GAUZE/BANDAGES/DRESSINGS) ×2 IMPLANT
DURAPREP 26ML APPLICATOR (WOUND CARE) ×4 IMPLANT
ELECT REM PT RETURN 9FT ADLT (ELECTROSURGICAL) ×2
ELECTRODE REM PT RTRN 9FT ADLT (ELECTROSURGICAL) ×1 IMPLANT
GAUZE XEROFORM 5X9 LF (GAUZE/BANDAGES/DRESSINGS) ×2 IMPLANT
GLOVE BIO SURGEON ST LM GN SZ9 (GLOVE) IMPLANT
GLOVE BIO SURGEON STRL SZ 6 (GLOVE) ×2 IMPLANT
GLOVE BIOGEL PI IND STRL 6.5 (GLOVE) ×2 IMPLANT
GLOVE BIOGEL PI IND STRL 8 (GLOVE) ×1 IMPLANT
GLOVE BIOGEL PI INDICATOR 6.5 (GLOVE) ×2
GLOVE BIOGEL PI INDICATOR 8 (GLOVE) ×1
GLOVE SURG ORTHO 8.0 STRL STRW (GLOVE) ×2 IMPLANT
GLOVE SURG SS PI 6.0 STRL IVOR (GLOVE) ×2 IMPLANT
GOWN PREVENTION PLUS LG XLONG (DISPOSABLE) IMPLANT
GOWN PREVENTION PLUS XLARGE (GOWN DISPOSABLE) ×2 IMPLANT
GOWN STRL NON-REIN LRG LVL3 (GOWN DISPOSABLE) ×4 IMPLANT
IMMOBILIZER KNEE 24 THIGH 36 (MISCELLANEOUS) ×1 IMPLANT
IMMOBILIZER KNEE 24 UNIV (MISCELLANEOUS) ×2
KIT BASIN OR (CUSTOM PROCEDURE TRAY) ×2 IMPLANT
KIT ROOM TURNOVER OR (KITS) ×2 IMPLANT
MANIFOLD NEPTUNE II (INSTRUMENTS) IMPLANT
NEEDLE 18GX1X1/2 (RX/OR ONLY) (NEEDLE) ×2 IMPLANT
NEEDLE 22X1 1/2 (OR ONLY) (NEEDLE) ×2 IMPLANT
NEEDLE SPNL 18GX3.5 QUINCKE PK (NEEDLE) IMPLANT
NS IRRIG 1000ML POUR BTL (IV SOLUTION) ×4 IMPLANT
PACK ORTHO EXTREMITY (CUSTOM PROCEDURE TRAY) ×2 IMPLANT
PAD ARMBOARD 7.5X6 YLW CONV (MISCELLANEOUS) ×4 IMPLANT
PAD CAST 4YDX4 CTTN HI CHSV (CAST SUPPLIES) ×1 IMPLANT
PADDING CAST ABS 4INX4YD NS (CAST SUPPLIES) ×1
PADDING CAST ABS COTTON 4X4 ST (CAST SUPPLIES) ×1 IMPLANT
PADDING CAST COTTON 4X4 STRL (CAST SUPPLIES) ×1
PADDING CAST COTTON 6X4 STRL (CAST SUPPLIES) ×2 IMPLANT
PASSER SUT SWANSON 36MM LOOP (INSTRUMENTS) ×2 IMPLANT
PENCIL BUTTON HOLSTER BLD 10FT (ELECTRODE) ×2 IMPLANT
SPONGE GAUZE 4X4 12PLY (GAUZE/BANDAGES/DRESSINGS) ×2 IMPLANT
SPONGE LAP 18X18 X RAY DECT (DISPOSABLE) ×4 IMPLANT
STAPLER VISISTAT 35W (STAPLE) ×2 IMPLANT
STOCKINETTE IMPERVIOUS 9X36 MD (GAUZE/BANDAGES/DRESSINGS) ×2 IMPLANT
STRIP CLOSURE SKIN 1/2X4 (GAUZE/BANDAGES/DRESSINGS) ×2 IMPLANT
SUT FIBERWIRE #2 38 T-5 BLUE (SUTURE) ×4
SUT PROLENE 3 0 PS 2 (SUTURE) ×2 IMPLANT
SUT VIC AB 0 CT1 27 (SUTURE) ×2
SUT VIC AB 0 CT1 27XBRD ANBCTR (SUTURE) ×2 IMPLANT
SUT VIC AB 1 CT1 27 (SUTURE) ×4
SUT VIC AB 1 CT1 27XBRD ANBCTR (SUTURE) ×4 IMPLANT
SUT VIC AB 2-0 CT1 27 (SUTURE) ×3
SUT VIC AB 2-0 CT1 TAPERPNT 27 (SUTURE) ×3 IMPLANT
SUT VICRYL AB 2 0 TIES (SUTURE) IMPLANT
SUTURE FIBERWR #2 38 T-5 BLUE (SUTURE) ×2 IMPLANT
SYR CONTROL 10ML LL (SYRINGE) ×2 IMPLANT
SYR TB 1ML LUER SLIP (SYRINGE) ×2 IMPLANT
TOWEL OR 17X26 10 PK STRL BLUE (TOWEL DISPOSABLE) ×2 IMPLANT
TUBE CONNECTING 12X1/4 (SUCTIONS) ×2 IMPLANT
WATER STERILE IRR 1000ML POUR (IV SOLUTION) IMPLANT
YANKAUER SUCT BULB TIP NO VENT (SUCTIONS) ×2 IMPLANT

## 2012-10-01 NOTE — Preoperative (Signed)
Beta Blockers   Reason not to administer Beta Blockers:Not Applicable 

## 2012-10-01 NOTE — Brief Op Note (Signed)
10/01/2012  5:46 PM  PATIENT:  Peter Gross  79 y.o. male  PRE-OPERATIVE DIAGNOSIS:  Left Quadriceps Tendon Rupture  POST-OPERATIVE DIAGNOSIS:  Left Quadriceps Tendon Rupture  PROCEDURE:  Procedure(s): REPAIR QUADRICEP TENDON- left  SURGEON:  Surgeon(s): Cammy Copa, MD  ASSISTANT:   ANESTHESIA:   general  EBL: 50 ml    Total I/O In: 1000 [I.V.:1000] Out: -   BLOOD ADMINISTERED: none  DRAINS: none   LOCAL MEDICATIONS USED:  none  SPECIMEN:  No Specimen  COUNTS:  YES  TOURNIQUET:  * No tourniquets in log *  DICTATION: .Other Dictation: Dictation Number [id number]  PLAN OF CARE: Discharge to home after PACU  PATIENT DISPOSITION:  PACU - hemodynamically stable

## 2012-10-01 NOTE — Anesthesia Preprocedure Evaluation (Signed)
Anesthesia Evaluation  Patient identified by MRN, date of birth, ID band Patient awake    Airway Mallampati: I      Dental  (+) Teeth Intact   Pulmonary COPD breath sounds clear to auscultation        Cardiovascular hypertension, + Peripheral Vascular Disease Rate:Normal     Neuro/Psych    GI/Hepatic GERD-  ,  Endo/Other    Renal/GU      Musculoskeletal   Abdominal   Peds  Hematology   Anesthesia Other Findings   Reproductive/Obstetrics                           Anesthesia Physical Anesthesia Plan  ASA: II  Anesthesia Plan: Spinal   Post-op Pain Management:    Induction:   Airway Management Planned: Natural Airway and Simple Face Mask  Additional Equipment:   Intra-op Plan:   Post-operative Plan:   Informed Consent: I have reviewed the patients History and Physical, chart, labs and discussed the procedure including the risks, benefits and alternatives for the proposed anesthesia with the patient or authorized representative who has indicated his/her understanding and acceptance.     Plan Discussed with:   Anesthesia Plan Comments:         Anesthesia Quick Evaluation

## 2012-10-01 NOTE — Op Note (Signed)
NAME:  Peter Gross, Peter Gross NO.:  [id number]  MEDICAL RECORD NO.:  [id number]  LOCATION:  [facility]                         FACILITY:  [facility]  PHYSICIAN:  Burnard Bunting, M.D.    DATE OF BIRTH:  04-14-1926  DATE OF PROCEDURE: DATE OF DISCHARGE:                              OPERATIVE REPORT   PREOPERATIVE DIAGNOSIS:  Left quadriceps tendon rupture.  POSTOPERATIVE DIAGNOSIS:  Left quadriceps tendon rupture.  PROCEDURE:  Left quadriceps tendon rupture repair.  SURGEON:  Burnard Bunting, M.D.  ASSISTANT:  None.  ANESTHESIA:  General.  INDICATIONS:  The patient is an 79 year old, active patient with left quadriceps tendon rupture, presents for operative management after explanation of risks and benefits.  PROCEDURE IN DETAIL:  The patient was brought to the operating room, where general endotracheal anesthesia was induced.  Preoperative antibiotics were administered.  Time-out was called.  Left leg was pre- scrubbed with alcohol and Betadine solution, which allowed to air dry, then prepped with DuraPrep solution, and draped in a sterile manner. Collier Flowers was used to cover the operative field.  Tourniquet not utilized. Incision was made over the inferior pole of patella extended proximally 4 fingerbreadths above the superior pole of the patella.  Incision was made.  Skin and subcutaneous tissue was sharply divided.  Quad tendon with rupture was identified.  The patient had a broad tendon.  Thorough irrigation of the joint was performed.  Bone edge of the proximal patella was prepared with a knife and a curette.  Three drill holes were placed into distance based on the proximal area the native quadriceps tendon attachment.  Two #2 FiberWire sutures were then placed in grasping Kessler fashion through the quad tendon.  Four strands exited the distal end of the quad tendon.  The stump was also prepared by trimming away the devitalized appearing tissue with the knife. Retinacular  repair was then performed using #1 Vicryl suture.  The tendon was then pulled nicely into the bony trough through the drill holes.  Two strands placed to the central drill hole.  One strand each to the peripheral drill holes.  Drill holes made with a small drill bit. The sutures were then tied in extension.  These FiberWire sutures gave excellent reapproximation of the native quad tendon to the bony trough. At this time, thorough irrigation was performed.  The skin edges were anesthetized using Marcaine, morphine, and clonidine.  The knee joint also anesthetized using Marcaine, morphine, and clonidine.  At this time, the skin was closed using interrupted inverted 0 Vicryl suture, 2- 0 Vicryl suture, and running 3-0 pullout Prolene.  Steri-Strips, bulky knee dressing applied.  The patient tolerated the procedure well without immediate complication.  PLAN:  Discharged home after spinal anesthetic wears off.  Aspirin for DVT prophylaxis.  Pain medicine prescribed.     Burnard Bunting, M.D.     GSD/MEDQ  D:  10/01/2012  T:  10/01/2012  Job:  [id number]

## 2012-10-01 NOTE — Anesthesia Procedure Notes (Addendum)
Spinal  Patient location during procedure: OR Start time: 10/01/2012 3:45 PM End time: 10/01/2012 4:00 PM Staffing Anesthesiologist: Ester Rink TERRILL Performed by: anesthesiologist  Preanesthetic Checklist Completed: patient identified, site marked, surgical consent, pre-op evaluation, timeout performed, IV checked, risks and benefits discussed and monitors and equipment checked Spinal Block Patient position: sitting Prep: Betadine Patient monitoring: heart rate, cardiac monitor, continuous pulse ox and blood pressure Approach: right paramedian Location: L3-4 Injection technique: single-shot Needle Needle type: Tuohy  Needle gauge: 22 G Needle length: 5 cm Needle insertion depth: 3 cm Assessment Sensory level: T6 Additional Notes Tolerated well.Required paramedian approach due to narrowed midline space.  Procedure Name: LMA Insertion Date/Time: 10/01/2012 4:47 PM Performed by: Gwenyth Allegra Pre-anesthesia Checklist: Patient identified, Timeout performed, Emergency Drugs available, Suction available and Patient being monitored Patient Re-evaluated:Patient Re-evaluated prior to inductionOxygen Delivery Method: Circle system utilized Preoxygenation: Pre-oxygenation with 100% oxygen Intubation Type: IV induction LMA: LMA inserted LMA Size: 5.0 Number of attempts: 1 Placement Confirmation: positive ETCO2 and breath sounds checked- equal and bilateral Dental Injury: Teeth and Oropharynx as per pre-operative assessment     Performed by: Gwenyth Allegra

## 2012-10-01 NOTE — Transfer of Care (Signed)
Immediate Anesthesia Transfer of Care Note  Patient: Peter Gross  Procedure(s) Performed: Procedure(s): REPAIR QUADRICEP TENDON- left (Left)  Patient Location: PACU  Anesthesia Type:GA combined with regional for post-op pain  Level of Consciousness: awake, alert  and oriented  Airway & Oxygen Therapy: Patient Spontanous Breathing and Patient connected to nasal cannula oxygen  Post-op Assessment: Report given to PACU RN and Post -op Vital signs reviewed and stable  Post vital signs: Reviewed and stable  Complications: No apparent anesthesia complications

## 2012-10-01 NOTE — H&P (Signed)
Peter Gross is an 79 y.o. male.   Chief Complaint: left leg pain HPI: [name] is a 79 year old patient with left leg pain he sustained an injury falling off the steps a week ago. MRI scan consistent with left partial tendon rupture. Patient did not suffer any other orthopedic injuries. He denies any other orthopedic complaints other than left leg weakness there is no family history of DVT or pulmonary embolism  Past Medical History  Diagnosis Date  . Obesity   . Hypertension   . Diabetes mellitus   . Benign prostatic hypertrophy   . PVD (peripheral vascular disease)   . COPD (chronic obstructive pulmonary disease)   . Early cataracts, bilateral     Hx: of  . GERD (gastroesophageal reflux disease)     Hx:" of once in awhile"  . Arthritis     Hx: of in both knees    Past Surgical History  Procedure Laterality Date  . Rotator cuff repair    . Tonsillectomy    . Colonoscopy w/ biopsies and polypectomy      Hx: of     Family History  Problem Relation Age of Onset  . Hypertension Mother   . Dementia Father    Social History:  reports that he quit smoking about 34 years ago. His smoking use included Cigarettes. He smoked 0.00 packs per day. He has never used smokeless tobacco. He reports that he does not drink alcohol or use illicit drugs.  Allergies: No Known Allergies  Medications Prior to Admission  Medication Sig Dispense Refill  . aspirin 81 MG EC tablet Take 81 mg by mouth daily.        . calcium carbonate (TUMS - DOSED IN MG ELEMENTAL CALCIUM) 500 MG chewable tablet Chew 1 tablet by mouth as needed for heartburn.      . finasteride (PROSCAR) 5 MG tablet Take 5 mg by mouth daily.        . lansoprazole (PREVACID) 15 MG capsule Take 15 mg by mouth daily as needed (for acid reflux).       . naproxen sodium (ANAPROX) 220 MG tablet Take 220 mg by mouth daily as needed (for pain).       . Tamsulosin HCl (FLOMAX) 0.4 MG CAPS Take 0.4 mg by mouth daily.        [name]  telmisartan (MICARDIS) 20 MG tablet Take 10 mg by mouth daily.        Results for orders placed during the hospital encounter of 10/01/12 (from the past 48 hour(s))  GLUCOSE, CAPILLARY     Status: None   Collection Time    10/01/12  1:38 PM      Result Value Range   Glucose-Capillary 96  70 - 99 mg/dL  BASIC METABOLIC PANEL     Status: Abnormal   Collection Time    10/01/12  1:42 PM      Result Value Range   Sodium 138  135 - 145 mEq/L   Potassium 4.1  3.5 - 5.1 mEq/L   Chloride 101  96 - 112 mEq/L   CO2 27  19 - 32 mEq/L   Glucose, Bld 112 (*) 70 - 99 mg/dL   BUN 15  6 - 23 mg/dL   Creatinine, Ser 1.61  0.50 - 1.35 mg/dL   Calcium 9.7  8.4 - 09.6 mg/dL   GFR calc non Af Amer 80 (*) >90 mL/min   GFR calc Af Amer >90  >90 mL/min   Comment:  The eGFR has been calculated     using the CKD EPI equation.     This calculation has not been     validated in all clinical     situations.     eGFR's persistently     <90 mL/min signify     possible Chronic Kidney Disease.  CBC     Status: None   Collection Time    10/01/12  1:42 PM      Result Value Range   WBC 10.3  4.0 - 10.5 K/uL   RBC 4.56  4.22 - 5.81 MIL/uL   Hemoglobin 14.5  13.0 - 17.0 g/dL   HCT 45.4  09.8 - 11.9 %   MCV 91.2  78.0 - 100.0 fL   MCH 31.8  26.0 - 34.0 pg   MCHC 34.9  30.0 - 36.0 g/dL   RDW 14.7  82.9 - 56.2 %   Platelets 226  150 - 400 K/uL   No results found.  Review of Systems  Constitutional: Negative.   HENT: Negative.   Eyes: Negative.   Respiratory: Negative.   Cardiovascular: Negative.   Gastrointestinal: Negative.   Genitourinary: Negative.   Musculoskeletal: Positive for joint pain.  Skin: Negative.   Neurological: Negative.   Endo/Heme/Allergies: Negative.   Psychiatric/Behavioral: Negative.     Blood pressure 158/71, pulse 77, temperature 97.1 F (36.2 C), temperature source Oral, resp. rate 18, height 5\' 9"  (1.753 m), weight 80.74 kg (178 lb), SpO2 100.00%. Physical  Exam  Constitutional: He appears well-developed.  HENT:  Head: Normocephalic.  Eyes: Pupils are equal, round, and reactive to light.  Neck: Normal range of motion.  Cardiovascular: Normal rate.   Respiratory: Effort normal.  GI: Soft.  Neurological: He is alert.  Skin: Skin is warm.  Psychiatric: He has a normal mood and affect.   left leg demonstrates palpable defect above the patella with knee flexion he has a 45 extensor lag and no active leg extension. Collaterals are stable. Effusion is present. Imaging tendons palpable and intact posteriorly. Posterior ecchymosis is present.  Assessment/Plan Impression is left leg quadriceps tendon rupture. MRI scan confirms same. Mild tricompartmental degenerative changes present. Plan is for quadriceps tendon rupture repair. Risk and benefits discussed with the patient that a limited to infection nerve vessel damage weakness knee stiffness. Plan on outpatient surgery with the DC home. Patient has good family support. All questions answered  Logon [employer] SCOTT 10/01/2012, 3:18 PM

## 2012-10-02 NOTE — Anesthesia Postprocedure Evaluation (Signed)
  Anesthesia Post-op Note  Patient: Peter Gross  Procedure(s) Performed: Procedure(s): REPAIR QUADRICEP TENDON- left (Left)  Patient Location: PACU  Anesthesia Type:Spinal  Level of Consciousness: awake  Airway and Oxygen Therapy: Patient Spontanous Breathing  Post-op Pain: mild  Post-op Assessment: Post-op Vital signs reviewed  Post-op Vital Signs: stable  Complications: No apparent anesthesia complications

## 2012-10-05 ENCOUNTER — Encounter (HOSPITAL_COMMUNITY): Payer: Self-pay | Admitting: Orthopedic Surgery

## 2012-12-13 ENCOUNTER — Other Ambulatory Visit: Payer: Self-pay | Admitting: Internal Medicine

## 2012-12-21 ENCOUNTER — Encounter: Payer: Self-pay | Admitting: Internal Medicine

## 2012-12-21 ENCOUNTER — Other Ambulatory Visit (INDEPENDENT_AMBULATORY_CARE_PROVIDER_SITE_OTHER): Payer: Medicare Other

## 2012-12-21 ENCOUNTER — Ambulatory Visit (INDEPENDENT_AMBULATORY_CARE_PROVIDER_SITE_OTHER)
Admission: RE | Admit: 2012-12-21 | Discharge: 2012-12-21 | Disposition: A | Discharge status: Home / Self Care | Payer: Medicare Other | Source: Ambulatory Visit | Attending: Internal Medicine | Admitting: Internal Medicine

## 2012-12-21 ENCOUNTER — Ambulatory Visit (INDEPENDENT_AMBULATORY_CARE_PROVIDER_SITE_OTHER): Payer: Medicare Other | Admitting: Internal Medicine

## 2012-12-21 VITALS — BP 120/74 | HR 78 | Temp 98.0°F | Ht 71.0 in | Wt 176.0 lb

## 2012-12-21 DIAGNOSIS — E785 Hyperlipidemia, unspecified: Secondary | ICD-10-CM

## 2012-12-21 DIAGNOSIS — J449 Chronic obstructive pulmonary disease, unspecified: Secondary | ICD-10-CM

## 2012-12-21 DIAGNOSIS — I10 Essential (primary) hypertension: Secondary | ICD-10-CM

## 2012-12-21 DIAGNOSIS — E119 Type 2 diabetes mellitus without complications: Secondary | ICD-10-CM

## 2012-12-21 DIAGNOSIS — J4489 Other specified chronic obstructive pulmonary disease: Secondary | ICD-10-CM

## 2012-12-21 DIAGNOSIS — I739 Peripheral vascular disease, unspecified: Secondary | ICD-10-CM

## 2012-12-21 LAB — URINALYSIS
Bilirubin Urine: NEGATIVE
Ketones, ur: NEGATIVE
Leukocytes, UA: NEGATIVE
Specific Gravity, Urine: 1.02 (ref 1.000–1.030)
Total Protein, Urine: NEGATIVE
Urine Glucose: NEGATIVE
Urobilinogen, UA: 0.2 (ref 0.0–1.0)

## 2012-12-21 LAB — CBC WITH DIFFERENTIAL/PLATELET
Basophils Relative: 0.6 % (ref 0.0–3.0)
Eosinophils Absolute: 0.2 10*3/uL (ref 0.0–0.7)
Eosinophils Relative: 2.5 % (ref 0.0–5.0)
HCT: 44.5 % (ref 39.0–52.0)
Hemoglobin: 15 g/dL (ref 13.0–17.0)
Lymphs Abs: 1.9 10*3/uL (ref 0.7–4.0)
Monocytes Absolute: 0.6 10*3/uL (ref 0.1–1.0)
Monocytes Relative: 9.2 % (ref 3.0–12.0)
Neutro Abs: 4.2 10*3/uL (ref 1.4–7.7)
RBC: 4.8 Mil/uL (ref 4.22–5.81)
WBC: 6.9 10*3/uL (ref 4.5–10.5)

## 2012-12-21 LAB — HEPATIC FUNCTION PANEL
ALT: 17 U/L (ref 0–53)
AST: 19 U/L (ref 0–37)
Alkaline Phosphatase: 59 U/L (ref 39–117)
Bilirubin, Direct: 0.1 mg/dL (ref 0.0–0.3)
Total Bilirubin: 1.1 mg/dL (ref 0.3–1.2)

## 2012-12-21 LAB — LIPID PANEL
HDL: 33.8 mg/dL — ABNORMAL LOW (ref >39.00)
LDL Cholesterol: 103 mg/dL — ABNORMAL HIGH (ref 0–99)
Triglycerides: 125 mg/dL (ref 0.0–149.0)
VLDL: 25 mg/dL (ref 0.0–40.0)

## 2012-12-21 LAB — MICROALBUMIN / CREATININE URINE RATIO: Microalb Creat Ratio: 0.5 mg/g (ref 0.0–30.0)

## 2012-12-21 LAB — TSH: TSH: 1.04 u[IU]/mL (ref 0.35–5.50)

## 2012-12-21 LAB — BASIC METABOLIC PANEL
CO2: 29 mEq/L (ref 19–32)
Calcium: 9.6 mg/dL (ref 8.4–10.5)
Creatinine, Ser: 0.8 mg/dL (ref 0.4–1.5)
Glucose, Bld: 132 mg/dL — ABNORMAL HIGH (ref 70–99)
Sodium: 139 mEq/L (ref 135–145)

## 2012-12-21 LAB — HEMOGLOBIN A1C: Hgb A1c MFr Bld: 6.4 % (ref 4.6–6.5)

## 2012-12-21 NOTE — Patient Instructions (Addendum)
Please remember to go to the lab and x-ray department downstairs for your tests - we will call you with the results when they are available.   Keep up appointments with Dr [name]  Please schedule a follow up visit in 12 months but call sooner if needed

## 2012-12-21 NOTE — Progress Notes (Signed)
Subjective:    Patient ID: Peter Gross, male    DOB: 1926-12-14.   MRN: [id number]    Brief patient profile:  86 yowm quit smoking 1980 with HBP and chronic nonprogressive doe and PFT's 11/16/2006 FEV1 112% but ratio 57%  And  developed aodm II controlled with diet since 2008, no overt symptoms followed by [name] as primary care pt    History of Present Illness  November 09, 2008 comprehensive eval  ov for bp check with no tia or claudicaiton symptoms, cbg's typically < 120, no sob though avoiding ex due to heat plays golf> no change rx     06/23/2012 f/u ov/[name] re muliple dx's as above plus new HB Chief Complaint  Patient presents with  . Follow-up    Pt c/o heart burn x 3 months, worse at night and some during the day.   symptoms mod severe, ? Started p taking aleve in pms after supper for arthritis, not taking prevacid approp, no dysphagia, no ex cp or sob/ diaphoresis, symptoms 4-5/7 nights a week  rec Either pepcid ac 20 mg as needed or use prevacid 15-30 mg 30-60 min before supper  Only take the aleve with meals as needed  GERD diet    12/21/2012 f/u ov/[name] re: annual eval hbp, borderline dm, gerd, djd Chief Complaint  Patient presents with  . Annual Exam    Had (L) knee surgery x3 months.  No other concerns today     Not limited by sob, not using any inhalers. fbg's around 130. No tia/ claudication/ cough   Sleeping ok without nocturnal  or early am exacerbation  of respiratory  c/o's or need for noct saba. Also denies any obvious fluctuation of symptoms with weather or environmental changes or other aggravating or alleviating factors except as outlined above   ROS  The following are not active complaints unless bolded sore throat, dysphagia, dental problems, itching, sneezing,  nasal congestion or excess/ purulent secretions, ear ache,   fever, chills, sweats, unintended wt loss, pleuritic or exertional cp, hemoptysis,  orthopnea pnd or leg swelling, presyncope,  palpitations, heartburn, abdominal pain, anorexia, nausea, vomiting, diarrhea  or change in bowel or urinary habits, change in stools or urine, dysuria,hematuria,  rash, arthralgias, visual complaints, headache, numbness weakness or ataxia or problems with walking or coordination,  change in mood/affect or memory.      Past Medical History:  Obesity  - Target wt = 171 for BMI < 26  HYPERTENSION (ICD-401.9)  - neg cardiolyte 02/2005  DM (ICD-250.00)  - dx 09/2006, diet contolled  BENIGN PROSTATIC HYPERTROPHY/elevated psa  ...................[name]  Hx of peptic ulcer disease  DIVERTICULITIS, HX OF (ICD-V12.79..............................................[name]  - colonoscopy 11/08/02  PVD (ICD-443.9)  - asym pedal pulses, asymptomatic  COPD  - PFT's 11/16/06 FEV1 3.0 (112%) with ratio 57 % and no rev, nl dlco  Health Maintenance............................................................................[name]  - Td 2006 - Pneumovax 2004  - CPX 12/21/2012     Family History:  HBP mother  Dementia / ? cva Father  2 siblings healthy one older sister, one younger brother No FH of Colon Cancer   Social History:  Retired--Telephone Administrator, Civil Service, still active  Married  3 Childern  Quit smoking 1980  No ETOH  Daily Caffeine Use: 2 daily           Objective:   Physical Exam   Robust wm, alert and approp ambulatory wm nad  wt 187 October 29, 2007 > 188 May 08, 2009 >  188 May 09, 2009 > 181 October 08, 2009 > 176 11/01/2010 > 04/16/2011 183 >  12/18/2011  176 > 06/23/2012  180 > 176 12/21/2012  HEENT:dention ok with lower dentures, upper caps nl turbinates, and orophanx.  Neck without JVD/Nodes/TM  Lungs clear to A and P bilaterally without cough on insp or exp maneuvers, mild pectus excavatum and barrel chest  RRR no s3 or murmur or increase in P2. no edema. Pulses in feet slt asym esp reduced on right  Abd soft and benign with nl excursion in the supine position.  Ext warm  without calf tenderness, cyanosis clubbing.  Neuro: intact sensorium, no motor or cerebellar def or path reflexes MS Nl gait, no deformities or joint restrictions GU/ Recatal per Dr     CXR  12/21/2012 : COPD changes. No acute abnormalities.       Assessment & Plan:

## 2012-12-21 NOTE — Assessment & Plan Note (Addendum)
-   PFT's 11/16/06 FEV1 3.0 (112%) with ratio 57 % and no rev, nl dlco   No limiting sob, no aecopd > no rx needed

## 2012-12-22 NOTE — Progress Notes (Signed)
Quick Note:  Spoke with pt and notified of results per Dr. Wert. Pt verbalized understanding and denied any questions.  ______ 

## 2012-12-22 NOTE — Assessment & Plan Note (Signed)
Hemoglobin A1C  Date Value Range Status  12/21/2012 6.4  4.6 - 6.5 % Final     Glycemic Control Guidelines for People with Diabetes:Non Diabetic:  <6%Goal of Therapy: <7%Additional Action Suggested:  >8%   12/18/2011 6.4  4.6 - 6.5 % Final     Glycemic Control Guidelines for People with Diabetes:Non Diabetic:  <6%Goal of Therapy: <7%Additional Action Suggested:  >8%   11/01/2010 6.4  4.6 - 6.5 % Final     Glycemic Control Guidelines for People with Diabetes:Non Diabetic:  <6%Goal of Therapy: <7%Additional Action Suggested:  >8%     Adequate control on present rx, reviewed > no change in rx needed

## 2012-12-22 NOTE — Assessment & Plan Note (Signed)
Adequate control on present rx, reviewed > no change in rx needed   

## 2012-12-22 NOTE — Assessment & Plan Note (Signed)
-   Dorsal pedis on L only strong pulse on exam 11/01/10 but no assoc claudication > no change rx

## 2013-08-11 DIAGNOSIS — C44319 Basal cell carcinoma of skin of other parts of face: Secondary | ICD-10-CM | POA: Diagnosis not present

## 2013-08-11 DIAGNOSIS — L570 Actinic keratosis: Secondary | ICD-10-CM | POA: Diagnosis not present

## 2013-08-17 DIAGNOSIS — H905 Unspecified sensorineural hearing loss: Secondary | ICD-10-CM | POA: Diagnosis not present

## 2013-08-17 DIAGNOSIS — H6120 Impacted cerumen, unspecified ear: Secondary | ICD-10-CM | POA: Diagnosis not present

## 2013-08-26 DIAGNOSIS — C61 Malignant neoplasm of prostate: Secondary | ICD-10-CM | POA: Diagnosis not present

## 2013-09-01 DIAGNOSIS — C61 Malignant neoplasm of prostate: Secondary | ICD-10-CM | POA: Diagnosis not present

## 2013-09-01 DIAGNOSIS — N401 Enlarged prostate with lower urinary tract symptoms: Secondary | ICD-10-CM | POA: Diagnosis not present

## 2013-09-01 DIAGNOSIS — N138 Other obstructive and reflux uropathy: Secondary | ICD-10-CM | POA: Diagnosis not present

## 2013-09-05 DIAGNOSIS — Z0000 Encounter for general adult medical examination without abnormal findings: Secondary | ICD-10-CM | POA: Diagnosis not present

## 2013-10-26 DIAGNOSIS — Z85828 Personal history of other malignant neoplasm of skin: Secondary | ICD-10-CM | POA: Diagnosis not present

## 2013-10-26 DIAGNOSIS — L570 Actinic keratosis: Secondary | ICD-10-CM | POA: Diagnosis not present

## 2013-12-07 DIAGNOSIS — H52209 Unspecified astigmatism, unspecified eye: Secondary | ICD-10-CM | POA: Diagnosis not present

## 2013-12-07 DIAGNOSIS — H2510 Age-related nuclear cataract, unspecified eye: Secondary | ICD-10-CM | POA: Diagnosis not present

## 2013-12-07 DIAGNOSIS — H3530 Unspecified macular degeneration: Secondary | ICD-10-CM | POA: Diagnosis not present

## 2013-12-16 DIAGNOSIS — Z23 Encounter for immunization: Secondary | ICD-10-CM | POA: Diagnosis not present

## 2013-12-22 ENCOUNTER — Ambulatory Visit: Payer: Medicare Other | Admitting: Internal Medicine

## 2013-12-26 ENCOUNTER — Encounter: Payer: Self-pay | Admitting: Internal Medicine

## 2013-12-26 ENCOUNTER — Other Ambulatory Visit (INDEPENDENT_AMBULATORY_CARE_PROVIDER_SITE_OTHER): Payer: Medicare Other

## 2013-12-26 ENCOUNTER — Ambulatory Visit (INDEPENDENT_AMBULATORY_CARE_PROVIDER_SITE_OTHER)
Admission: RE | Admit: 2013-12-26 | Discharge: 2013-12-26 | Disposition: A | Discharge status: Home / Self Care | Payer: Medicare Other | Source: Ambulatory Visit | Attending: Internal Medicine | Admitting: Internal Medicine

## 2013-12-26 ENCOUNTER — Ambulatory Visit (INDEPENDENT_AMBULATORY_CARE_PROVIDER_SITE_OTHER): Payer: Medicare Other | Admitting: Internal Medicine

## 2013-12-26 VITALS — BP 126/80 | HR 86 | Temp 97.8°F | Ht 68.0 in | Wt 179.8 lb

## 2013-12-26 DIAGNOSIS — E785 Hyperlipidemia, unspecified: Secondary | ICD-10-CM

## 2013-12-26 DIAGNOSIS — Z23 Encounter for immunization: Secondary | ICD-10-CM

## 2013-12-26 DIAGNOSIS — J449 Chronic obstructive pulmonary disease, unspecified: Secondary | ICD-10-CM

## 2013-12-26 DIAGNOSIS — Z0000 Encounter for general adult medical examination without abnormal findings: Secondary | ICD-10-CM | POA: Diagnosis not present

## 2013-12-26 DIAGNOSIS — I10 Essential (primary) hypertension: Secondary | ICD-10-CM

## 2013-12-26 DIAGNOSIS — E119 Type 2 diabetes mellitus without complications: Secondary | ICD-10-CM

## 2013-12-26 DIAGNOSIS — R12 Heartburn: Secondary | ICD-10-CM

## 2013-12-26 DIAGNOSIS — I739 Peripheral vascular disease, unspecified: Secondary | ICD-10-CM

## 2013-12-26 LAB — URINALYSIS
Bilirubin Urine: NEGATIVE
HGB URINE DIPSTICK: NEGATIVE
Ketones, ur: NEGATIVE
Leukocytes, UA: NEGATIVE
NITRITE: NEGATIVE
SPECIFIC GRAVITY, URINE: 1.02 (ref 1.000–1.030)
Total Protein, Urine: NEGATIVE
URINE GLUCOSE: NEGATIVE
Urobilinogen, UA: 0.2 (ref 0.0–1.0)
pH: 7.5 (ref 5.0–8.0)

## 2013-12-26 LAB — CBC WITH DIFFERENTIAL/PLATELET
BASOS ABS: 0.1 10*3/uL (ref 0.0–0.1)
Basophils Relative: 0.7 % (ref 0.0–3.0)
EOS PCT: 4.2 % (ref 0.0–5.0)
Eosinophils Absolute: 0.3 10*3/uL (ref 0.0–0.7)
HEMATOCRIT: 48.8 % (ref 39.0–52.0)
Hemoglobin: 16.3 g/dL (ref 13.0–17.0)
Lymphocytes Relative: 25 % (ref 12.0–46.0)
Lymphs Abs: 2 10*3/uL (ref 0.7–4.0)
MCHC: 33.4 g/dL (ref 30.0–36.0)
MCV: 94.2 fl (ref 78.0–100.0)
MONOS PCT: 8.5 % (ref 3.0–12.0)
Monocytes Absolute: 0.7 10*3/uL (ref 0.1–1.0)
Neutro Abs: 5 10*3/uL (ref 1.4–7.7)
Neutrophils Relative %: 61.6 % (ref 43.0–77.0)
Platelets: 218 10*3/uL (ref 150.0–400.0)
RBC: 5.18 Mil/uL (ref 4.22–5.81)
RDW: 13.5 % (ref 11.5–15.5)
WBC: 8.1 10*3/uL (ref 4.0–10.5)

## 2013-12-26 LAB — LIPID PANEL
CHOLESTEROL: 150 mg/dL (ref 0–200)
HDL: 26.2 mg/dL — AB (ref >39.00)
NonHDL: 123.8
Total CHOL/HDL Ratio: 6
Triglycerides: 209 mg/dL — ABNORMAL HIGH (ref 0.0–149.0)
VLDL: 41.8 mg/dL — AB (ref 0.0–40.0)

## 2013-12-26 LAB — BASIC METABOLIC PANEL
BUN: 12 mg/dL (ref 6–23)
CALCIUM: 9.7 mg/dL (ref 8.4–10.5)
CO2: 27 mEq/L (ref 19–32)
CREATININE: 0.8 mg/dL (ref 0.4–1.5)
Chloride: 100 mEq/L (ref 96–112)
GFR: 92.14 mL/min (ref >60.00)
GLUCOSE: 137 mg/dL — AB (ref 70–99)
Potassium: 4.5 mEq/L (ref 3.5–5.1)
Sodium: 137 mEq/L (ref 135–145)

## 2013-12-26 LAB — TSH: TSH: 0.79 u[IU]/mL (ref 0.35–4.50)

## 2013-12-26 LAB — LDL CHOLESTEROL, DIRECT: Direct LDL: 88.2 mg/dL

## 2013-12-26 LAB — HEMOGLOBIN A1C: HEMOGLOBIN A1C: 6.5 % (ref 4.6–6.5)

## 2013-12-26 NOTE — Patient Instructions (Signed)
Prevnar today   Please remember to go to the lab and x-ray department downstairs for your tests - we will call you with the results when they are available.  Prevacid or prilosec Take 30-60 min before first meal of the day   GERD (REFLUX)  is an extremely common cause of respiratory symptoms, many times with no significant heartburn at all.    It can be treated with medication, but also with lifestyle changes including avoidance of late meals, excessive alcohol, smoking cessation, and avoid fatty foods, chocolate, peppermint, colas, red wine, and acidic juices such as orange juice.  NO MINT OR MENTHOL PRODUCTS SO NO COUGH DROPS  USE SUGARLESS CANDY INSTEAD (jolley ranchers or Stover's)  NO OIL BASED VITAMINS - use powdered substitutes.       Please schedule a follow up visit in 6 months but call sooner if needed

## 2013-12-26 NOTE — Progress Notes (Signed)
Quick Note:  Pt aware ______ 

## 2013-12-26 NOTE — Progress Notes (Signed)
Subjective:    Patient ID: Peter Gross, male    DOB: Feb 19, 1927.   MRN: [id number]    Brief patient profile:  86 yowm quit smoking 1980 with HBP and chronic nonprogressive doe and PFT's 11/16/2006 FEV1 112% but ratio 57%  And  developed aodm II controlled with diet since 2008, no overt symptoms followed by Peter Gross as primary care pt    History of Present Illness  November 09, 2008 comprehensive eval  ov for bp check with no tia or claudicaiton symptoms, cbg's typically < 120, no sob though avoiding ex due to heat plays golf> no change rx    06/23/2012 f/u ov/Peter Gross re muliple dx's as above plus new HB Chief Complaint  Patient presents with  . Follow-up    Pt c/o heart burn x 3 months, worse at night and some during the day.   symptoms mod severe, ? Started p taking aleve in pms after supper for arthritis, not taking prevacid approp, no dysphagia, no ex cp or sob/ diaphoresis, symptoms 4-5/7 nights a week  rec Either pepcid ac 20 mg as needed or use prevacid 15-30 mg 30-60 min before supper  Only take the aleve with meals as needed  GERD diet   12/26/2013 f/u ov/Peter Gross re: comprehensive eval / hbp, borderline dm, gerd, djd/pvd Chief Complaint  Patient presents with  . Annual Exam    Pt is fasting. He c/o heartburn recently- tums help some.   not using prevacid as rec  Not limited by breathing from desired activities  Nor by cp, claudication Not no inhalers at all   No   chronic cough or    chest tightness, subjective wheeze overt sinus or hb symptoms. No unusual exp hx or h/o childhood pna/ asthma or knowledge of premature birth.  Sleeping ok without nocturnal  or early am exacerbation  of respiratory  c/o's or need for noct saba. Also denies any obvious fluctuation of symptoms with weather or environmental changes or other aggravating or alleviating factors except as outlined above   Current Medications, Allergies, Complete Past Medical History, Past Surgical History, Family  History, and Social History were reviewed in [facility] record.  ROS  The following are not active complaints unless bolded sore throat, dysphagia, dental problems, itching, sneezing,  nasal congestion or excess/ purulent secretions, ear ache,   fever, chills, sweats, unintended wt loss, pleuritic or exertional cp, hemoptysis,  orthopnea pnd or leg swelling, presyncope, palpitations, heartburn, abdominal pain, anorexia, nausea, vomiting, diarrhea  or change in bowel or urinary habits, change in stools or urine, dysuria,hematuria,  rash, arthralgias, visual complaints, headache, numbness weakness or ataxia or problems with walking or coordination,  change in mood/affect or memory.           Past Medical History:  Obesity  - Target wt = 171 for BMI < 26  HYPERTENSION (ICD-401.9)  - neg cardiolyte 02/2005  DM (ICD-250.00)  - dx 09/2006, diet contolled  BENIGN PROSTATIC HYPERTROPHY/elevated psa  ...................[name]  Hx of peptic ulcer disease  DIVERTICULITIS, HX OF (ICD-V12.79..............................................Marland KitchenDeatra Gross  - colonoscopy 11/08/02  PVD (ICD-443.9)  - decreased  pedal pulses, asymptomatic  COPD  GOLD I - PFT's 11/16/06 FEV1 3.0 (112%) with ratio 57 % and no rev, nl dlco  Health Maintenance............................................................................[name]  - Td 2006 - Pneumovax 2004, prevnar 12/26/2013  - CPX 12/26/2013     Family History:  HBP mother  Dementia / ? cva Father  2 siblings healthy one older sister, one younger  brother No FH of Colon Cancer   Social History:  [employer], still active  Married  3 Childern  Quit smoking 1980  No ETOH  Daily Caffeine Use: 2 daily           Objective:   Physical Exam   Robust wm, alert and approp ambulatory wm nad  wt 187 October 29, 2007 > 188 May 08, 2009 > 188 May 09, 2009 > 181 October 08, 2009 > 176 11/01/2010 > 04/16/2011 183 >   12/18/2011  176 > 06/23/2012  180 > 176 12/21/2012 > 12/26/2013 180 . HEENT:dention ok with lower dentures, upper caps nl turbinates, and orophanx.  Neck without JVD/Nodes/TM  Lungs clear to A and P bilaterally without cough on insp or exp maneuvers, mild pectus excavatum and barrel chest  RRR no s3 or murmur or increase in P2. no edema. Pulses in feet  reduced bilaterally Abd soft and benign with nl excursion in the supine position.  Ext warm without calf tenderness, cyanosis clubbing.  Neuro: intact sensorium, no motor or cerebellar def or path reflexes MS Nl gait, no deformities or joint restrictions GU/ Recatal per Dr Jeffie Pollock    CXR  12/26/2013 : No active cardiopulmonary disease.   Recent Labs Lab 12/26/13 0924  NA 137  K 4.5  CL 100  CO2 27  BUN 12  CREATININE 0.8  GLUCOSE 137*    Recent Labs Lab 12/26/13 0924  HGB 16.3  HCT 48.8  WBC 8.1  PLT 218.0     Lab Results  Component Value Date   TSH 0.79 12/26/2013          Assessment & Plan:

## 2013-12-27 ENCOUNTER — Encounter: Payer: Self-pay | Admitting: Internal Medicine

## 2013-12-27 NOTE — Assessment & Plan Note (Signed)
-   PFT's 11/16/06 FEV1 3.0 (112%) with ratio 57 % and no rev, nl dlco   No symptoms, no tendency to aecopd > no treatment needed

## 2013-12-27 NOTE — Progress Notes (Signed)
Quick Note:  Spoke with pt and notified of results per Dr. Wert. Pt verbalized understanding and denied any questions.  ______ 

## 2013-12-27 NOTE — Assessment & Plan Note (Signed)
No symptoms or complications to date, treat risk factors

## 2013-12-27 NOTE — Assessment & Plan Note (Signed)
Lab Results  Component Value Date   HGBA1C 6.5 12/26/2013   HGBA1C 6.4 12/21/2012   HGBA1C 6.4 12/18/2011       Adequate control on present rx (diet) , reviewed > no change in rx needed

## 2013-12-27 NOTE — Assessment & Plan Note (Signed)
Adequate control on present rx, reviewed > no change in rx needed   

## 2013-12-27 NOTE — Assessment & Plan Note (Signed)
rec ppi and diet/ See instructions for specific recommendations which were reviewed directly with the patient who was given a copy with highlighter outlining the key components.

## 2013-12-27 NOTE — Assessment & Plan Note (Signed)
-   Target LDL < 70 given PVD  Lab Results  Component Value Date   CHOL 150 12/26/2013   HDL 26.20* 12/26/2013   LDLCALC 103* 12/21/2012   LDLDIRECT 88.2 12/26/2013   TRIG 209.0* 12/26/2013   CHOLHDL 6 12/26/2013     Adequate control on present rx, reviewed > no change in rx needed

## 2013-12-31 ENCOUNTER — Other Ambulatory Visit: Payer: Self-pay | Admitting: Internal Medicine

## 2014-02-27 DIAGNOSIS — C61 Malignant neoplasm of prostate: Secondary | ICD-10-CM | POA: Diagnosis not present

## 2014-03-15 DIAGNOSIS — N5201 Erectile dysfunction due to arterial insufficiency: Secondary | ICD-10-CM | POA: Diagnosis not present

## 2014-03-15 DIAGNOSIS — C61 Malignant neoplasm of prostate: Secondary | ICD-10-CM | POA: Diagnosis not present

## 2014-03-15 DIAGNOSIS — N433 Hydrocele, unspecified: Secondary | ICD-10-CM | POA: Diagnosis not present

## 2014-03-15 DIAGNOSIS — R3913 Splitting of urinary stream: Secondary | ICD-10-CM | POA: Diagnosis not present

## 2014-04-26 ENCOUNTER — Other Ambulatory Visit: Payer: Self-pay | Admitting: Dermatology

## 2014-04-26 DIAGNOSIS — C44212 Basal cell carcinoma of skin of right ear and external auricular canal: Secondary | ICD-10-CM | POA: Diagnosis not present

## 2014-04-26 DIAGNOSIS — Z85828 Personal history of other malignant neoplasm of skin: Secondary | ICD-10-CM | POA: Diagnosis not present

## 2014-04-26 DIAGNOSIS — L570 Actinic keratosis: Secondary | ICD-10-CM | POA: Diagnosis not present

## 2014-04-26 DIAGNOSIS — Z08 Encounter for follow-up examination after completed treatment for malignant neoplasm: Secondary | ICD-10-CM | POA: Diagnosis not present

## 2014-05-05 ENCOUNTER — Other Ambulatory Visit: Payer: Self-pay | Admitting: Internal Medicine

## 2014-06-26 ENCOUNTER — Other Ambulatory Visit (INDEPENDENT_AMBULATORY_CARE_PROVIDER_SITE_OTHER): Payer: Medicare Other

## 2014-06-26 ENCOUNTER — Encounter: Payer: Self-pay | Admitting: Internal Medicine

## 2014-06-26 ENCOUNTER — Ambulatory Visit (INDEPENDENT_AMBULATORY_CARE_PROVIDER_SITE_OTHER): Payer: Medicare Other | Admitting: Internal Medicine

## 2014-06-26 VITALS — BP 130/70 | HR 75 | Ht 68.0 in | Wt 177.4 lb

## 2014-06-26 DIAGNOSIS — N401 Enlarged prostate with lower urinary tract symptoms: Secondary | ICD-10-CM

## 2014-06-26 DIAGNOSIS — E119 Type 2 diabetes mellitus without complications: Secondary | ICD-10-CM | POA: Diagnosis not present

## 2014-06-26 DIAGNOSIS — N138 Other obstructive and reflux uropathy: Secondary | ICD-10-CM

## 2014-06-26 DIAGNOSIS — I10 Essential (primary) hypertension: Secondary | ICD-10-CM

## 2014-06-26 DIAGNOSIS — J449 Chronic obstructive pulmonary disease, unspecified: Secondary | ICD-10-CM | POA: Diagnosis not present

## 2014-06-26 LAB — BASIC METABOLIC PANEL
BUN: 12 mg/dL (ref 6–23)
CO2: 29 mEq/L (ref 19–32)
Calcium: 9.6 mg/dL (ref 8.4–10.5)
Chloride: 103 mEq/L (ref 96–112)
Creatinine, Ser: 0.85 mg/dL (ref 0.40–1.50)
GFR: 90.78 mL/min (ref >60.00)
Glucose, Bld: 103 mg/dL — ABNORMAL HIGH (ref 70–99)
Potassium: 4.3 mEq/L (ref 3.5–5.1)
Sodium: 137 mEq/L (ref 135–145)

## 2014-06-26 LAB — HEMOGLOBIN A1C: Hgb A1c MFr Bld: 6.4 % (ref 4.6–6.5)

## 2014-06-26 NOTE — Progress Notes (Signed)
Subjective:    Patient ID: Peter Gross, male    DOB: 05-26-1926.   MRN: [id number]    Brief patient profile:  86 yowm quit smoking 1980 with HBP and chronic nonprogressive doe and PFT's 11/16/2006 FEV1 112% but ratio 57%  And  developed aodm II controlled with diet since 2008, no overt symptoms followed by [name] as primary care pt    History of Present Illness  November 09, 2008 comprehensive eval  ov for bp check with no tia or claudicaiton symptoms, cbg's typically < 120, no sob though avoiding ex due to heat plays golf> no change rx    06/23/2012 f/u ov/Jousha Schwandt re muliple dx's as above plus new HB Chief Complaint  Patient presents with  . Follow-up    Pt c/o heart burn x 3 months, worse at night and some during the day.   symptoms mod severe, ? Started p taking aleve in pms after supper for arthritis, not taking prevacid approp, no dysphagia, no ex cp or sob/ diaphoresis, symptoms 4-5/7 nights a week  rec Either pepcid ac 20 mg as needed or use prevacid 15-30 mg 30-60 min before supper  Only take the aleve with meals as needed  GERD diet   12/26/2013 f/u ov/Anish Vana re: comprehensive eval / hbp, borderline dm, gerd, djd/pvd Chief Complaint  Patient presents with  . Annual Exam    Pt is fasting. He c/o heartburn recently- tums help some.   not using prevacid as rec  Not limited by breathing from desired activities  Nor by cp, claudication Not no inhalers at all  rec Prevnar today  Prevacid or prilosec Take 30-60 min before first meal of the day  GERD diet    06/26/2014 f/u ov/Chela Sutphen re: hbp, borderline dm, gerd, djd/pvd Chief Complaint  Patient presents with  . Follow-up    Pt states doing well and denies any new co's today.     Not limited by breathing from desired activities    No obvious day to day or daytime variabilty or assoc chronic cough or cp or chest tightness, subjective wheeze overt sinus or hb symptoms. No unusual exp hx or h/o childhood pna/ asthma or  knowledge of premature birth.  Sleeping ok without nocturnal  or early am exacerbation  of respiratory  c/o's or need for noct saba. Also denies any obvious fluctuation of symptoms with weather or environmental changes or other aggravating or alleviating factors except as outlined above   Current Medications, Allergies, Complete Past Medical History, Past Surgical History, Family History, and Social History were reviewed in [facility] record.  ROS  The following are not active complaints unless bolded sore throat, dysphagia, dental problems, itching, sneezing,  nasal congestion or excess/ purulent secretions, ear ache,   fever, chills, sweats, unintended wt loss, pleuritic or exertional cp, hemoptysis,  orthopnea pnd or leg swelling, presyncope, palpitations, heartburn, abdominal pain, anorexia, nausea, vomiting, diarrhea  or change in bowel or urinary habits, change in stools or urine, dysuria,hematuria,  rash, arthralgias, visual complaints, headache, numbness weakness or ataxia or problems with walking or coordination,  change in mood/affect or memory.                Past Medical History:  Obesity  - Target wt = 171 for BMI < 26  HYPERTENSION (ICD-401.9)  - neg cardiolyte 02/2005  DM (ICD-250.00)  - dx 09/2006, diet contolled  BENIGN PROSTATIC HYPERTROPHY/elevated psa  ...................[name]  Hx of peptic ulcer disease  DIVERTICULITIS,  HX OF (ICD-V12.79..............................................Marland KitchenDeatra Ina  - colonoscopy 11/08/02  PVD (ICD-443.9)  - decreased  pedal pulses, asymptomatic  COPD  GOLD I - PFT's 11/16/06 FEV1 3.0 (112%) with ratio 57 % and no rev, nl dlco  Health Maintenance............................................................................[name]  - Td 2006,  Tdap  2013  - Pneumovax 2004, prevnar 12/26/2013  - CPX 12/26/2013     Family History:  HBP mother  Dementia / ? cva Father  2 siblings healthy one older sister, one younger  brother No FH of Colon Cancer   Social History:  Retired[employer], still active  Married  3 Childern  Quit smoking 1980  No ETOH  Daily Caffeine Use: 2 daily           Objective:   Physical Exam   Robust wm, alert and approp ambulatory wm nad  wt 187 October 29, 2007 > 188 May 08, 2009 > 188 May 09, 2009 > 181 October 08, 2009 > 176 11/01/2010 > 04/16/2011 183 >  12/18/2011  176 > 06/23/2012  180 > 176 12/21/2012 > 12/26/2013 180  > 06/26/14  177  HEENT:dention ok with lower dentures, upper caps nl turbinates, and orophanx.  Neck without JVD/Nodes/TM  Lungs clear to A and P bilaterally without cough on insp or exp maneuvers, mild pectus excavatum and barrel chest  RRR no s3 or murmur or increase in P2. no edema. Pulses in feet  reduced bilaterally Abd soft and benign with nl excursion in the supine position.  Ext warm without calf tenderness, cyanosis clubbing.  Neuro: intact sensorium, no motor or cerebellar def or path reflexes MS Nl gait, no deformities or joint restrictions      I personally reviewed images and agree with radiology impression as follows:  CXR:  12/27/14   No active cardiopulmonary disease  Labs ordered/ reviewed   Lab Results  Component Value Date   HGBA1C 6.4 06/26/2014   HGBA1C 6.5 12/26/2013   HGBA1C 6.4 12/21/2012    Labs ordered/ reviewed      Lab 06/26/14 1100  NA 137  K 4.3  CL 103  CO2 29  BUN 12  CREATININE 0.85  GLUCOSE 103*        Assessment & Plan:

## 2014-06-26 NOTE — Patient Instructions (Addendum)
Please remember to go to the lab   department downstairs for your tests - we will call you with the results when they are available.  No need to check sugars more than once a week and only fasting (before bfast) and let me know if over 140 consistently   cpx due 12/2014

## 2014-06-27 ENCOUNTER — Encounter: Payer: Self-pay | Admitting: Internal Medicine

## 2014-06-27 NOTE — Progress Notes (Signed)
Quick Note:  Spoke with pt and notified of results per Dr. Wert. Pt verbalized understanding and denied any questions.  ______ 

## 2014-06-27 NOTE — Assessment & Plan Note (Signed)
hgba1c's trending down just on diet/ ex > Adequate control on present rx, reviewed > no change in rx needed

## 2014-06-27 NOTE — Assessment & Plan Note (Signed)
   Lab Results  Component Value Date   CREATININE 0.85 06/26/2014   CREATININE 0.8 12/26/2013   CREATININE 0.8 12/21/2012     No change creat > Adequate control on present rx, reviewed > no change in rx needed

## 2014-06-27 NOTE — Assessment & Plan Note (Signed)
-   PFT's 11/16/06 FEV1 3.0 (112%) with ratio 57 % and no rev, nl dlco   Not limited from activities due to sob/ no aecopd >  Adequate control on present rx, reviewed > no change in rx needed

## 2014-06-27 NOTE — Assessment & Plan Note (Signed)
Followed by Urology/ Dr J [name]> on flomax/ proscar 

## 2014-07-06 DIAGNOSIS — L570 Actinic keratosis: Secondary | ICD-10-CM | POA: Diagnosis not present

## 2014-07-06 DIAGNOSIS — Z85828 Personal history of other malignant neoplasm of skin: Secondary | ICD-10-CM | POA: Diagnosis not present

## 2014-07-06 DIAGNOSIS — Z08 Encounter for follow-up examination after completed treatment for malignant neoplasm: Secondary | ICD-10-CM | POA: Diagnosis not present

## 2014-07-22 ENCOUNTER — Emergency Department (HOSPITAL_COMMUNITY)
Admission: EM | Admit: 2014-07-22 | Discharge: 2014-07-22 | Disposition: A | Discharge status: Home / Self Care | Payer: Medicare Other | Attending: Emergency Medicine | Admitting: Emergency Medicine

## 2014-07-22 ENCOUNTER — Encounter (HOSPITAL_COMMUNITY): Payer: Self-pay | Admitting: *Deleted

## 2014-07-22 ENCOUNTER — Emergency Department (HOSPITAL_COMMUNITY): Payer: Medicare Other

## 2014-07-22 DIAGNOSIS — M179 Osteoarthritis of knee, unspecified: Secondary | ICD-10-CM | POA: Insufficient documentation

## 2014-07-22 DIAGNOSIS — E669 Obesity, unspecified: Secondary | ICD-10-CM | POA: Diagnosis not present

## 2014-07-22 DIAGNOSIS — I10 Essential (primary) hypertension: Secondary | ICD-10-CM | POA: Insufficient documentation

## 2014-07-22 DIAGNOSIS — K625 Hemorrhage of anus and rectum: Secondary | ICD-10-CM | POA: Insufficient documentation

## 2014-07-22 DIAGNOSIS — Z79899 Other long term (current) drug therapy: Secondary | ICD-10-CM | POA: Diagnosis not present

## 2014-07-22 DIAGNOSIS — K219 Gastro-esophageal reflux disease without esophagitis: Secondary | ICD-10-CM | POA: Diagnosis not present

## 2014-07-22 DIAGNOSIS — J449 Chronic obstructive pulmonary disease, unspecified: Secondary | ICD-10-CM | POA: Insufficient documentation

## 2014-07-22 DIAGNOSIS — R195 Other fecal abnormalities: Secondary | ICD-10-CM | POA: Diagnosis present

## 2014-07-22 DIAGNOSIS — Z8669 Personal history of other diseases of the nervous system and sense organs: Secondary | ICD-10-CM | POA: Diagnosis not present

## 2014-07-22 DIAGNOSIS — N400 Enlarged prostate without lower urinary tract symptoms: Secondary | ICD-10-CM | POA: Insufficient documentation

## 2014-07-22 DIAGNOSIS — E119 Type 2 diabetes mellitus without complications: Secondary | ICD-10-CM | POA: Insufficient documentation

## 2014-07-22 DIAGNOSIS — Z7982 Long term (current) use of aspirin: Secondary | ICD-10-CM | POA: Diagnosis not present

## 2014-07-22 DIAGNOSIS — K5900 Constipation, unspecified: Secondary | ICD-10-CM | POA: Diagnosis not present

## 2014-07-22 LAB — CBC WITH DIFFERENTIAL/PLATELET
BASOS PCT: 0 % (ref 0–1)
Basophils Absolute: 0 10*3/uL (ref 0.0–0.1)
EOS ABS: 0.3 10*3/uL (ref 0.0–0.7)
EOS PCT: 2 % (ref 0–5)
HCT: 41.7 % (ref 39.0–52.0)
Hemoglobin: 14.4 g/dL (ref 13.0–17.0)
LYMPHS PCT: 16 % (ref 12–46)
Lymphs Abs: 1.6 10*3/uL (ref 0.7–4.0)
MCH: 31.5 pg (ref 26.0–34.0)
MCHC: 34.5 g/dL (ref 30.0–36.0)
MCV: 91.2 fL (ref 78.0–100.0)
MONOS PCT: 9 % (ref 3–12)
Monocytes Absolute: 0.9 10*3/uL (ref 0.1–1.0)
NEUTROS ABS: 7.5 10*3/uL (ref 1.7–7.7)
NEUTROS PCT: 73 % (ref 43–77)
Platelets: 166 10*3/uL (ref 150–400)
RBC: 4.57 MIL/uL (ref 4.22–5.81)
RDW: 12.9 % (ref 11.5–15.5)
WBC: 10.3 10*3/uL (ref 4.0–10.5)

## 2014-07-22 LAB — BASIC METABOLIC PANEL
ANION GAP: 9 (ref 5–15)
BUN: 11 mg/dL (ref 6–20)
CHLORIDE: 104 mmol/L (ref 101–111)
CO2: 23 mmol/L (ref 22–32)
CREATININE: 0.84 mg/dL (ref 0.61–1.24)
Calcium: 8.7 mg/dL — ABNORMAL LOW (ref 8.9–10.3)
GFR calc Af Amer: >60 mL/min (ref >60)
GLUCOSE: 148 mg/dL — AB (ref 65–99)
Potassium: 3.9 mmol/L (ref 3.5–5.1)
Sodium: 136 mmol/L (ref 135–145)

## 2014-07-22 LAB — POC OCCULT BLOOD, ED: Fecal Occult Bld: POSITIVE — AB

## 2014-07-22 MED ORDER — POLYETHYLENE GLYCOL 3350 17 G PO PACK: 17.0000 g | PACK | Freq: Every day | ORAL | Status: DC

## 2014-07-22 MED ORDER — DOCUSATE SODIUM 100 MG PO CAPS: 100.0000 mg | ORAL_CAPSULE | Freq: Two times a day (BID) | ORAL | Status: DC

## 2014-07-22 NOTE — ED Provider Notes (Signed)
CSN: [id number]     Arrival date & time 07/22/14  0725 History   First MD Initiated Contact with Patient 07/22/14 [phone]     Chief Complaint  Patient presents with  . Blood In Stools     (Consider location/radiation/quality/duration/timing/severity/associated sxs/prior Treatment) HPI Comments: Patient presents to the ER for evaluation of blood in his stools. Patient reports that he normally has a bowel movement every day. 2 days ago he did not have a bowel movement, then yesterday he felt like he had difficulty trying to have a hard bowel movement. He noticed blood mixed with the stool and he has had several episodes of small amount of blood from his rectum since then. He is not experiencing any abdominal pain. No chest pain, shortness of breath, weakness or passing out.   Past Medical History  Diagnosis Date  . Obesity   . Hypertension   . Diabetes mellitus   . Benign prostatic hypertrophy   . PVD (peripheral vascular disease)   . COPD (chronic obstructive pulmonary disease)   . Early cataracts, bilateral     Hx: of  . GERD (gastroesophageal reflux disease)     Hx:" of once in awhile"  . Arthritis     Hx: of in both knees   Past Surgical History  Procedure Laterality Date  . Rotator cuff repair    . Tonsillectomy    . Colonoscopy w/ biopsies and polypectomy      Hx: of   . Quadriceps tendon repair Left 10/01/2012    Procedure: REPAIR QUADRICEP TENDON- left;  Surgeon: Meredith Pel, MD;  Location: [place];  Service: Orthopedics;  Laterality: Left;   Family History  Problem Relation Age of Onset  . Hypertension Mother   . Dementia Father    History  Substance Use Topics  . Smoking status: Unknown If Ever Smoked  . Smokeless tobacco: Never Used     Comment: "never really inhaled"  . Alcohol Use: No    Review of Systems  Gastrointestinal: Positive for constipation and blood in stool.  All other systems reviewed and are negative.     Allergies  Review of  patient's allergies indicates no known allergies.  Home Medications   Prior to Admission medications   Medication Sig Start Date End Date Taking? Authorizing Provider  aspirin EC 81 MG tablet Take 81 mg by mouth daily.   Yes Historical Provider, MD  calcium carbonate (TUMS - DOSED IN MG ELEMENTAL CALCIUM) 500 MG chewable tablet Chew 1 tablet by mouth as needed for heartburn.   Yes Historical Provider, MD  finasteride (PROSCAR) 5 MG tablet Take 5 mg by mouth daily.     Yes Historical Provider, MD  lansoprazole (PREVACID) 15 MG capsule Take 15 mg by mouth daily as needed (for acid reflux).    Yes Historical Provider, MD  Multiple Vitamins-Minerals (VISION VITAMINS PO) Take 2 tablets by mouth daily.   Yes Historical Provider, MD  naproxen sodium (ANAPROX) 220 MG tablet Take 220 mg by mouth daily as needed (for pain).    Yes Historical Provider, MD  Tamsulosin HCl (FLOMAX) 0.4 MG CAPS Take 0.4 mg by mouth daily.     Yes Historical Provider, MD  telmisartan (MICARDIS) 20 MG tablet TAKE 1/2 TABLET IN THE MORNING 01/02/14  Yes Tanda Rockers, MD  aspirin EC 325 MG tablet Take 1 tablet (325 mg total) by mouth daily. Patient not taking: Reported on 07/22/2014 10/01/12   Meredith Pel, MD   BP  147/68 mmHg  Pulse 74  Temp(Src) 97.8 F (36.6 C) (Oral)  Resp 18  Ht 5\' 8"  (1.727 m)  Wt 170 lb (77.111 kg)  BMI 25.85 kg/m2  SpO2 96% Physical Exam  Constitutional: He is oriented to person, place, and time. He appears well-developed and well-nourished. No distress.  HENT:  Head: Normocephalic and atraumatic.  Right Ear: Hearing normal.  Left Ear: Hearing normal.  Nose: Nose normal.  Mouth/Throat: Oropharynx is clear and moist and mucous membranes are normal.  Eyes: Conjunctivae and EOM are normal. Pupils are equal, round, and reactive to light.  Neck: Normal range of motion. Neck supple.  Cardiovascular: Regular rhythm, S1 normal and S2 normal.  Exam reveals no gallop and no friction rub.   No  murmur heard. Pulmonary/Chest: Effort normal and breath sounds normal. No respiratory distress. He exhibits no tenderness.  Abdominal: Soft. Normal appearance and bowel sounds are normal. There is no hepatosplenomegaly. There is no tenderness. There is no rebound, no guarding, no tenderness at McBurney's point and negative Murphy's sign. No hernia.  Genitourinary: Prostate normal. Rectal exam shows no external hemorrhoid, no fissure, no mass, no tenderness and anal tone normal.  Musculoskeletal: Normal range of motion.  Neurological: He is alert and oriented to person, place, and time. He has normal strength. No cranial nerve deficit or sensory deficit. Coordination normal. GCS eye subscore is 4. GCS verbal subscore is 5. GCS motor subscore is 6.  Skin: Skin is warm, dry and intact. No rash noted. No cyanosis.  Psychiatric: He has a normal mood and affect. His speech is normal and behavior is normal. Thought content normal.  Nursing note and vitals reviewed.   ED Course  Procedures (including critical care time) Labs Review Labs Reviewed  BASIC METABOLIC PANEL - Abnormal; Notable for the following:    Glucose, Bld 148 (*)    Calcium 8.7 (*)    All other components within normal limits  CBC WITH DIFFERENTIAL/PLATELET  POC OCCULT BLOOD, ED    Imaging Review Dg Abd Acute W/chest  07/22/2014   CLINICAL DATA:  Rectal bleeding.  EXAM: DG ABDOMEN ACUTE W/ 1V CHEST  COMPARISON:  December 26, 2013  FINDINGS: There is no evidence of dilated bowel loops or free intraperitoneal air. No radiopaque calculi or other significant radiographic abnormality is seen. Heart size and mediastinal contours are within normal limits. Both lungs are clear.  IMPRESSION: Negative abdominal radiographs.  No acute cardiopulmonary disease.   Electronically Signed   By: Abelardo Diesel M.D.   On: 07/22/2014 08:52     EKG Interpretation None      MDM   Final diagnoses:  Constipation  rectal bleeding  Patient  presents to the ER for evaluation of rectal bleeding. Patient reports that he has been constipated for the last 2 days. He just noticed blood mixed with his stools. He has not had large amounts of blood, has had small stools mixed with blood each time he has had a bowel movement. He has no abdominal pain or tenderness. Vital signs are stable. Hemoglobin is normal. X-ray does not show any acute abnormality. I did review the images myself, there is a large stool burden. Patient will be treated with Colace, MiraLAX. He is likely having some bleeding from the straining and constipation, but will require follow-up with gastroenterology for possible colonoscopy.    Orpah Greek, MD 07/22/14 [phone]

## 2014-07-22 NOTE — Discharge Instructions (Signed)
Bloody Stools °Bloody stools often mean that there is a problem in the digestive tract. Your caregiver may use the term "melena" to describe black, tarry, and bad smelling stools or "hematochezia" to describe red or maroon-colored stools. Blood seen in the stool can be caused by bleeding anywhere along the intestinal tract.  °A black stool usually means that blood is coming from the upper part of the gastrointestinal tract (esophagus, stomach, or small bowel). Passing maroon-colored stools or bright red blood usually means that blood is coming from lower down in the large bowel or the rectum. However, sometimes massive bleeding in the stomach or small intestine can cause bright red bloody stools.  °Consuming black licorice, lead, iron pills, medicines containing bismuth subsalicylate, or blueberries can also cause black stools. Your caregiver can test black stools to see if blood is present. °It is important that the cause of the bleeding be found. Treatment can then be started, and the problem can be corrected. Rectal bleeding may not be serious, but you should not assume everything is okay until you know the cause. It is very important to follow up with your caregiver or a specialist in gastrointestinal problems. °CAUSES  °Blood in the stools can come from various underlying causes. Often, the cause is not found during your first visit. Testing is often needed to discover the cause of bleeding in the gastrointestinal tract. Causes range from simple to serious or even life-threatening. Possible causes include: °· Hemorrhoids. These are veins that are full of blood (engorged) in the rectum. They cause pain, inflammation, and may bleed. °· Anal fissures. These are areas of painful tearing which may bleed. They are often caused by passing hard stool. °· Diverticulosis. These are pouches that form on the colon over time, with age, and may bleed significantly. °· Diverticulitis. This is inflammation in areas with  diverticulosis. It can cause pain, fever, and bloody stools, although bleeding is rare. °· Proctitis and colitis. These are inflamed areas of the rectum or colon. They may cause pain, fever, and bloody stools. °· Polyps and cancer. Colon cancer is a leading cause of preventable cancer death. It often starts out as precancerous polyps that can be removed during a colonoscopy, preventing progression into cancer. Sometimes, polyps and cancer may cause rectal bleeding. °· Gastritis and ulcers. Bleeding from the upper gastrointestinal tract (near the stomach) may travel through the intestines and produce black, sometimes tarry, often bad smelling stools. In certain cases, if the bleeding is fast enough, the stools may not be black, but red and the condition may be life-threatening. °SYMPTOMS  °You may have stools that are bright red and bloody, that are normal color with blood on them, or that are dark black and tarry. In some cases, you may only have blood in the toilet bowl. Any of these cases need medical care. You may also have: °· Pain at the anus or anywhere in the rectum. °· Lightheadedness or feeling faint. °· Extreme weakness. °· Nausea or vomiting. °· Fever. °DIAGNOSIS °Your caregiver may use the following methods to find the cause of your bleeding: °· Taking a medical history. Age is important. Older people tend to develop polyps and cancer more often. If there is anal pain and a hard, large stool associated with bleeding, a tear of the anus may be the cause. If blood drips into the toilet after a bowel movement, bleeding hemorrhoids may be the problem. The color and frequency of the bleeding are additional considerations. In most cases, the medical history provides clues, but seldom the final   answer. °· A visual and finger (digital) exam. Your caregiver will inspect the anal area, looking for tears and hemorrhoids. A finger exam can provide information when there is tenderness or a growth inside. In men, the  prostate is also examined. °· Endoscopy. Several types of small, long scopes (endoscopes) are used to view the colon. °· In the office, your caregiver may use a rigid, or more commonly, a flexible viewing sigmoidoscope. This exam is called flexible sigmoidoscopy. It is performed in 5 to 10 minutes. °· A more thorough exam is accomplished with a colonoscope. It allows your caregiver to view the entire 5 to 6 foot long colon. Medicine to help you relax (sedative) is usually given for this exam. Frequently, a bleeding lesion may be present beyond the reach of the sigmoidoscope. So, a colonoscopy may be the best exam to start with. Both exams are usually done on an outpatient basis. This means the patient does not stay overnight in the hospital or surgery center. °· An upper endoscopy may be needed to examine your stomach. Sedation is used and a flexible endoscope is put in your mouth, down to your stomach. °· A barium enema X-ray. This is an X-ray exam. It uses liquid barium inserted by enema into the rectum. This test alone may not identify an actual bleeding point. X-rays highlight abnormal shadows, such as those made by lumps (tumors), diverticuli, or colitis. °TREATMENT  °Treatment depends on the cause of your bleeding.  °· For bleeding from the stomach or colon, the caregiver doing your endoscopy or colonoscopy may be able to stop the bleeding as part of the procedure. °· Inflammation or infection of the colon can be treated with medicines. °· Many rectal problems can be treated with creams, suppositories, or warm baths. °· Surgery is sometimes needed. °· Blood transfusions are sometimes needed if you have lost a lot of blood. °· For any bleeding problem, let your caregiver know if you take aspirin or other blood thinners regularly. °HOME CARE INSTRUCTIONS  °· Take any medicines exactly as prescribed. °· Keep your stools soft by eating a diet high in fiber. Prunes (1 to 3 a day) work well for many people. °· Drink  enough water and fluids to keep your urine clear or pale yellow. °· Take sitz baths if advised. A sitz bath is when you sit in a bathtub with warm water for 10 to 15 minutes to soak, soothe, and cleanse the rectal area. °· If enemas or suppositories are advised, be sure you know how to use them. Tell your caregiver if you have problems with this. °· Monitor your bowel movements to look for signs of improvement or worsening. °SEEK MEDICAL CARE IF:  °· You do not improve in the time expected. °· Your condition worsens after initial improvement. °· You develop any new symptoms. °SEEK IMMEDIATE MEDICAL CARE IF:  °· You develop severe or prolonged rectal bleeding. °· You vomit blood. °· You feel weak or faint. °· You have a fever. °MAKE SURE YOU: °· Understand these instructions. °· Will watch your condition. °· Will get help right away if you are not doing well or get worse. °Document Released: 02/14/2002 Document Revised: 05/19/2011 Document Reviewed: 07/12/2010 °[employer]® Patient Information ©2015 [employer]. This information is not intended to replace advice given to you by your health care provider. Make sure you discuss any questions you have with your health care provider. ° °Constipation °Constipation is when a person has fewer than three bowel movements   a week, has difficulty having a bowel movement, or has stools that are dry, hard, or larger than normal. As people grow older, constipation is more common. If you try to fix constipation with medicines that make you have a bowel movement (laxatives), the problem may get worse. Long-term laxative use may cause the muscles of the colon to become weak. A low-fiber diet, not taking in enough fluids, and taking certain medicines may make constipation worse.  °CAUSES  °· Certain medicines, such as antidepressants, pain medicine, iron supplements, antacids, and water pills.   °· Certain diseases, such as diabetes, irritable bowel syndrome (IBS), thyroid disease, or  depression.   °· Not drinking enough water.   °· Not eating enough fiber-rich foods.   °· Stress or travel.   °· Lack of physical activity or exercise.   °· Ignoring the urge to have a bowel movement.   °· Using laxatives too much.   °SIGNS AND SYMPTOMS  °· Having fewer than three bowel movements a week.   °· Straining to have a bowel movement.   °· Having stools that are hard, dry, or larger than normal.   °· Feeling full or bloated.   °· Pain in the lower abdomen.   °· Not feeling relief after having a bowel movement.   °DIAGNOSIS  °Your health care provider will take a medical history and perform a physical exam. Further testing may be done for severe constipation. Some tests may include: °· A barium enema X-ray to examine your rectum, colon, and, sometimes, your small intestine.   °· A sigmoidoscopy to examine your lower colon.   °· A colonoscopy to examine your entire colon. °TREATMENT  °Treatment will depend on the severity of your constipation and what is causing it. Some dietary treatments include drinking more fluids and eating more fiber-rich foods. Lifestyle treatments may include regular exercise. If these diet and lifestyle recommendations do not help, your health care provider may recommend taking over-the-counter laxative medicines to help you have bowel movements. Prescription medicines may be prescribed if over-the-counter medicines do not work.  °HOME CARE INSTRUCTIONS  °· Eat foods that have a lot of fiber, such as fruits, vegetables, whole grains, and beans. °· Limit foods high in fat and processed sugars, such as french fries, hamburgers, cookies, candies, and soda.   °· A fiber supplement may be added to your diet if you cannot get enough fiber from foods.   °· Drink enough fluids to keep your urine clear or pale yellow.   °· Exercise regularly or as directed by your health care provider.   °· Go to the restroom when you have the urge to go. Do not hold it.   °· Only take over-the-counter or  prescription medicines as directed by your health care provider. Do not take other medicines for constipation without talking to your health care provider first.   °SEEK IMMEDIATE MEDICAL CARE IF:  °· You have bright red blood in your stool.   °· Your constipation lasts for more than 4 days or gets worse.   °· You have abdominal or rectal pain.   °· You have thin, pencil-like stools.   °· You have unexplained weight loss. °MAKE SURE YOU:  °· Understand these instructions. °· Will watch your condition. °· Will get help right away if you are not doing well or get worse. °Document Released: 11/23/2003 Document Revised: 03/01/2013 Document Reviewed: 12/06/2012 °[employer]® Patient Information ©2015 [employer]. This information is not intended to replace advice given to you by your health care provider. Make sure you discuss any questions you have with your health care provider. ° °

## 2014-07-22 NOTE — ED Notes (Signed)
Pt reports blood in stools since yesterday.

## 2014-07-24 ENCOUNTER — Encounter: Payer: Self-pay | Admitting: Gastroenterology

## 2014-09-13 DIAGNOSIS — C61 Malignant neoplasm of prostate: Secondary | ICD-10-CM | POA: Diagnosis not present

## 2014-09-20 DIAGNOSIS — C61 Malignant neoplasm of prostate: Secondary | ICD-10-CM | POA: Diagnosis not present

## 2014-09-20 DIAGNOSIS — R3913 Splitting of urinary stream: Secondary | ICD-10-CM | POA: Diagnosis not present

## 2014-09-20 DIAGNOSIS — K4090 Unilateral inguinal hernia, without obstruction or gangrene, not specified as recurrent: Secondary | ICD-10-CM | POA: Diagnosis not present

## 2014-09-20 DIAGNOSIS — N401 Enlarged prostate with lower urinary tract symptoms: Secondary | ICD-10-CM | POA: Diagnosis not present

## 2014-09-20 DIAGNOSIS — N138 Other obstructive and reflux uropathy: Secondary | ICD-10-CM | POA: Diagnosis not present

## 2014-09-20 DIAGNOSIS — N5201 Erectile dysfunction due to arterial insufficiency: Secondary | ICD-10-CM | POA: Diagnosis not present

## 2014-10-02 ENCOUNTER — Ambulatory Visit (INDEPENDENT_AMBULATORY_CARE_PROVIDER_SITE_OTHER): Payer: Medicare Other | Admitting: Gastroenterology

## 2014-10-02 ENCOUNTER — Encounter: Payer: Self-pay | Admitting: Gastroenterology

## 2014-10-02 VITALS — BP 130/70 | HR 58 | Ht 67.5 in | Wt 171.4 lb

## 2014-10-02 DIAGNOSIS — K921 Melena: Secondary | ICD-10-CM | POA: Diagnosis not present

## 2014-10-02 DIAGNOSIS — J449 Chronic obstructive pulmonary disease, unspecified: Secondary | ICD-10-CM

## 2014-10-02 MED ORDER — NA SULFATE-K SULFATE-MG SULF 17.5-3.13-1.6 GM/177ML PO SOLN: 1.0000 | Freq: Once | ORAL | Status: DC

## 2014-10-02 NOTE — Patient Instructions (Signed)

## 2014-10-02 NOTE — Assessment & Plan Note (Signed)
Patient has stable COPD without any recent respiratory complaints

## 2014-10-02 NOTE — Assessment & Plan Note (Signed)
Hematochezia in May, 2016 that was limited and preceded by constipation.  Bleeding may have been related to hemorrhoids or possibly a diverticular bleed.  A structural mucosal abnormality of the colon should be ruled out.  Recommendations #1 colonoscopy  CC Dr. [name]

## 2014-10-02 NOTE — Addendum Note (Signed)
Addended by: Oda Kilts on: 10/02/2014 11:06 AM   Modules accepted: Orders

## 2014-10-02 NOTE — Progress Notes (Signed)
_                                                                                                                History of Present Illness:  Peter Gross is a pleasant 81 year old white male referred at the request of Dr. Shyrl Numbers for evaluation of rectal bleeding.  In May, 2016 he was seen in the ED for hematochezia.  He apparently had painless hematochezia on a couple of occasions.  Hemoglobin was 14.4.  He's had no recurrence of bleeding.  He claims that he was slightly constipated prior to the bleeding episodes.  He denies change of bowel habits.  Last colonoscopy in 2004 demonstrated diverticulosis.   Past Medical History  Diagnosis Date  . Obesity   . Hypertension   . Diabetes mellitus   . Benign prostatic hypertrophy   . PVD (peripheral vascular disease)   . COPD (chronic obstructive pulmonary disease)   . Early cataracts, bilateral     Hx: of  . GERD (gastroesophageal reflux disease)     Hx:" of once in awhile"  . Arthritis     Hx: of in both knees   Past Surgical History  Procedure Laterality Date  . Rotator cuff repair    . Tonsillectomy    . Colonoscopy w/ biopsies and polypectomy      Hx: of   . Quadriceps tendon repair Left 10/01/2012    Procedure: REPAIR QUADRICEP TENDON- left;  Surgeon: Meredith Pel, MD;  Location: [facility];  Service: Orthopedics;  Laterality: Left;   family history includes Dementia in his father; Hypertension in his mother. Current Outpatient Prescriptions  Medication Sig Dispense Refill  . aspirin 81 MG tablet Take 81 mg by mouth daily.    . calcium carbonate (TUMS - DOSED IN MG ELEMENTAL CALCIUM) 500 MG chewable tablet Chew 1 tablet by mouth as needed for heartburn.    . finasteride (PROSCAR) 5 MG tablet Take 5 mg by mouth daily.      . Multiple Vitamins-Minerals (VISION VITAMINS PO) Take 2 tablets by mouth 2 (two) times daily.     . Tamsulosin HCl (FLOMAX) 0.4 MG CAPS Take 0.4 mg by mouth daily.      [name] telmisartan  (MICARDIS) 20 MG tablet TAKE 1/2 TABLET IN THE MORNING 15 tablet 11   No current facility-administered medications for this visit.   Allergies as of 10/02/2014  . (No Known Allergies)    has an unknown smoking status. He has never used smokeless tobacco. He reports that he does not drink alcohol or use illicit drugs.   Review of Systems: Pertinent positive and negative review of systems were noted in the above HPI section. All other review of systems were otherwise negative.  Vital signs were reviewed in today's medical record Physical Exam: General: Well developed , well nourished, no acute distress Skin: anicteric Head: Normocephalic and atraumatic Eyes:  sclerae anicteric, EOMI Ears: Normal auditory acuity Mouth: No deformity or lesions Neck: Supple, no masses or thyromegaly Lymph Nodes:  no lymphadenopathy Lungs: Clear throughout to auscultation Heart: Regular rate and rhythm; no murmurs, rubs or bruits Gastroinestinal: Soft, non tender and non distended. No masses, hepatosplenomegaly or hernias noted. Normal Bowel sounds Rectal:deferred Musculoskeletal: Symmetrical with no gross deformities  Skin: No lesions on visible extremities Pulses:  Normal pulses noted Extremities: No clubbing, cyanosis, edema or deformities noted Neurological: Alert oriented x 4, grossly nonfocal Cervical Nodes:  No significant cervical adenopathy Inguinal Nodes: No significant inguinal adenopathy Psychological:  Alert and cooperative. Normal mood and affect  See Assessment and Plan under Problem List

## 2014-10-03 ENCOUNTER — Other Ambulatory Visit: Payer: Self-pay | Admitting: Surgery

## 2014-10-03 DIAGNOSIS — K429 Umbilical hernia without obstruction or gangrene: Secondary | ICD-10-CM | POA: Diagnosis not present

## 2014-10-03 DIAGNOSIS — K4020 Bilateral inguinal hernia, without obstruction or gangrene, not specified as recurrent: Secondary | ICD-10-CM | POA: Diagnosis not present

## 2014-10-03 NOTE — H&P (Signed)
Peter Gross 10/03/2014 2:09 PM Location: [facility] Patient #: [id number] DOB: 28-Oct-1926 Married / Language: Cleophus Molt / Race: White Male History of Present Illness Adin Hector MD; 10/03/2014 3:07 PM) Patient words: LIH.  The patient is a 81 year old male who presents with an inguinal hernia. Patient sent for consultation by his urologist, Dr. [name], for concern of LEFT inguinal hernia. 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 He noticed some groin bulging of the past year. Increased in size. He had an episode of pain last month. Mentioned to his urologist. LEFT inguinal hernia noted. Surgical consultation offered. Patient does have low-grade prostate cancer T1c Gleason 6 (3 +s 3). It remains stable for the past 6 years. Being followed every 6 months. Has had some urinary issues but markedly improved on finasteride and tamsulosin. Usually urinates only once at night. No major difficulty with urination. Can go to 18 holes pretty regularly although he confesses uses a golf cart. Not on oxygen. No history of heart attacks or strokes. On low-dose aspirin only. Allergies Elbert Ewings, [place]; 10/03/2014 2:10 PM) No Known Drug Allergies 10/03/2014  Medication History Elbert Ewings, CMA; 10/03/2014 2:11 PM) Telmisartan (20MG  Tablet, Oral) Active. Finasteride (5MG  Tablet, Oral) Active. Aspirin EC Low Strength (81MG  Tablet DR, Oral) Active. Tamsulosin HCl (0.4MG  Capsule, Oral) Active. Multiple Vitamin (Oral) Active. Calcium (500MG  Tablet, Oral) Active. Medications Reconciled     Review of Systems Elbert Ewings CMA; 10/03/2014 2:09 PM) General Not Present- Appetite Loss, Chills, Fatigue, Fever, Night Sweats, Weight Gain and Weight Loss. Skin Not Present- Change in Wart/Mole, Dryness, Hives, Jaundice, New Lesions, Non-Healing Wounds, Rash and Ulcer. HEENT Present- Hearing Loss. Not Present- Earache, Hoarseness, Nose Bleed, Oral Ulcers, Ringing in  the Ears, Seasonal Allergies, Sinus Pain, Sore Throat, Visual Disturbances, Wears glasses/contact lenses and Yellow Eyes. Respiratory Not Present- Bloody sputum, Chronic Cough, Difficulty Breathing, Snoring and Wheezing. Breast Not Present- Breast Mass, Breast Pain, Nipple Discharge and Skin Changes. Cardiovascular Not Present- Chest Pain, Difficulty Breathing Lying Down, Leg Cramps, Palpitations, Rapid Heart Rate, Shortness of Breath and Swelling of Extremities. Gastrointestinal Not Present- Abdominal Pain, Bloating, Bloody Stool, Change in Bowel Habits, Chronic diarrhea, Constipation, Difficulty Swallowing, Excessive gas, Gets full quickly at meals, Hemorrhoids, Indigestion, Nausea, Rectal Pain and Vomiting. Male Genitourinary Not Present- Blood in Urine, Change in Urinary Stream, Frequency, Impotence, Nocturia, Painful Urination, Urgency and Urine Leakage. Musculoskeletal Present- Back Pain and Joint Pain. Not Present- Joint Stiffness, Muscle Pain, Muscle Weakness and Swelling of Extremities. Neurological Not Present- Decreased Memory, Fainting, Headaches, Numbness, Seizures, Tingling, Tremor, Trouble walking and Weakness. Psychiatric Not Present- Anxiety, Bipolar, Change in Sleep Pattern, Depression, Fearful and Frequent crying. Endocrine Present- New Diabetes. Not Present- Cold Intolerance, Excessive Hunger, Hair Changes, Heat Intolerance and Hot flashes. Hematology Present- Easy Bruising. Not Present- Excessive bleeding, Gland problems, HIV and Persistent Infections.  Vitals Elbert Ewings CMA; 10/03/2014 2:11 PM) 10/03/2014 2:11 PM Weight: 171 lb Height: 67in Body Surface Area: 1.91 m Body Mass Index: 26.78 kg/m Temp.: 98.31F(Oral)  Pulse: 66 (Regular)  BP: 122/70 (Sitting, Left Arm, Standard)     Physical Exam Adin Hector MD; 10/03/2014 3:01 PM)  General Mental Status-Alert. General Appearance-Not in acute distress, Not Sickly. Orientation-Oriented  X3. Hydration-Well hydrated. Voice-Normal.  Integumentary Global Assessment Upon inspection and palpation of skin surfaces of the - Axillae: non-tender, no inflammation or ulceration, no drainage. and Distribution of scalp and body hair is normal. General Characteristics Temperature - normal warmth is noted.  Head and Neck  Head-normocephalic, atraumatic with no lesions or palpable masses. Face Global Assessment - atraumatic, no absence of expression. Neck Global Assessment - no abnormal movements, no bruit auscultated on the right, no bruit auscultated on the left, no decreased range of motion, non-tender. Trachea-midline. Thyroid Gland Characteristics - non-tender.  Eye Eyeball - Left-Extraocular movements intact, No Nystagmus. Eyeball - Right-Extraocular movements intact, No Nystagmus. Cornea - Left-No Hazy. Cornea - Right-No Hazy. Sclera/Conjunctiva - Left-No scleral icterus, No Discharge. Sclera/Conjunctiva - Right-No scleral icterus, No Discharge. Pupil - Left-Direct reaction to light normal. Pupil - Right-Direct reaction to light normal.  ENMT Ears Pinna - Left - no drainage observed, no generalized tenderness observed. Right - no drainage observed, no generalized tenderness observed. Nose and Sinuses External Inspection of the Nose - no destructive lesion observed. Inspection of the nares - Left - quiet respiration. Right - quiet respiration. Mouth and Throat Lips - Upper Lip - no fissures observed, no pallor noted. Lower Lip - no fissures observed, no pallor noted. Nasopharynx - no discharge present. Oral Cavity/Oropharynx - Tongue - no dryness observed. Oral Mucosa - no cyanosis observed. Hypopharynx - no evidence of airway distress observed.  Chest and Lung Exam Inspection Movements - Normal and Symmetrical. Accessory muscles - No use of accessory muscles in breathing. Palpation Palpation of the chest reveals -  Non-tender. Auscultation Breath sounds - Normal and Clear.  Cardiovascular Auscultation Rhythm - Regular. Murmurs & Other Heart Sounds - Auscultation of the heart reveals - No Murmurs and No Systolic Clicks.  Abdomen Inspection Inspection of the abdomen reveals - No Visible peristalsis and No Abnormal pulsations. Umbilicus - No Bleeding, No Urine drainage. Palpation/Percussion Palpation and Percussion of the abdomen reveal - Soft, Non Tender, No Rebound tenderness, No Rigidity (guarding) and No Cutaneous hyperesthesia. Note: Soft, flat. Obvious umbilical hernia. Reducible. 1 cm fascial defect.   Male Genitourinary Sexual Maturity Tanner 5 - Adult hair pattern and Adult penile size and shape. Note: Uncircumcised male. Slightly enlarged testes and epididymides but no discrete masses. Obvious LEFT Groin bulge. Ultimately reduces while laying supine. Small medial RIGHT groin bulge. Consistent with bilateral inguinal hernias, LEFT greater than RIGHT.   Peripheral Vascular Upper Extremity Inspection - Left - No Cyanotic nailbeds, Not Ischemic. Right - No Cyanotic nailbeds, Not Ischemic.  Neurologic Neurologic evaluation reveals -normal attention span and ability to concentrate, able to name objects and repeat phrases. Appropriate fund of knowledge , normal sensation and normal coordination. Mental Status Affect - not angry, not paranoid. Cranial Nerves-Normal Bilaterally. Gait-Normal.  Neuropsychiatric Mental status exam performed with findings of-able to articulate well with normal speech/language, rate, volume and coherence, thought content normal with ability to perform basic computations and apply abstract reasoning and no evidence of hallucinations, delusions, obsessions or homicidal/suicidal ideation.  Musculoskeletal Global Assessment Spine, Ribs and Pelvis - no instability, subluxation or laxity. Right Upper Extremity - no instability, subluxation or  laxity.  Lymphatic Head & Neck  General Head & Neck Lymphatics: Bilateral - Description - No Localized lymphadenopathy. Axillary  General Axillary Region: Bilateral - Description - No Localized lymphadenopathy. Femoral & Inguinal  Generalized Femoral & Inguinal Lymphatics: Left - Description - No Localized lymphadenopathy. Right - Description - No Localized lymphadenopathy.    Assessment & Plan Adin Hector MD; 10/03/2014 3:04 PM)  BILATERAL INGUINAL HERNIA WITHOUT OBSTRUCTION OR GANGRENE, RECURRENCE NOT SPECIFIED (550.92  K40.20) Impression: LEFT greater than RIGHT inguinal hernias. Because the LEFT side has increased in size and has become symptomatic, I offered surgical  repair. While he is of advanced age, he is quite functional and active which means he has a lot more years of life in him that could lead him into trouble. He wishes to be aggressive and undego repair. Good candidate for laparoscopic approach. Increased risk of urinary retention given a history of bladder outlet obstruction the past but urinary symptoms actually rather mild on his finasteride and tamsulosin  Current Plans Schedule for Surgery The anatomy & physiology of the abdominal wall and pelvic floor was discussed. The pathophysiology of hernias in the inguinal and pelvic region was discussed. Natural history risks such as progressive enlargement, pain, incarceration, and strangulation was discussed. Contributors to complications such as smoking, obesity, diabetes, prior surgery, etc were discussed.  I feel the risks of no intervention will lead to serious problems that outweigh the operative risks; therefore, I recommended surgery to reduce and repair the hernia. I explained laparoscopic techniques with possible need for an open approach. I noted usual use of mesh to patch and/or buttress hernia repair  Risks such as bleeding, infection, abscess, need for further treatment, heart attack, death, and other risks  were discussed. I noted a good likelihood this will help address the problem. Goals of post-operative recovery were discussed as well. Possibility that this will not correct all symptoms was explained. I stressed the importance of low-impact activity, aggressive pain control, avoiding constipation, & not pushing through pain to minimize risk of post-operative chronic pain or injury. Possibility of reherniation was discussed. We will work to minimize complications.  An educational handout further explaining the pathology & treatment options was given as well. Questions were answered. The patient expresses understanding & wishes to proceed with surgery. Pt Education - Pamphlet Given - Laparoscopic Hernia Repair: discussed with patient and provided information. Pt Education - [facility] Hernia Post-Op HCI (Anjannette Gauger): discussed with patient and provided information. Pt Education - [facility] Pelvic Floor Exercises (Kegels) and Dysfunction HCI (Sayde Lish) Pt Education - [place] (Latreece Mochizuki) Written instructions provided UMBILICAL HERNIA WITHOUT OBSTRUCTION AND WITHOUT GANGRENE (553.1  K42.9) Impression: Small reducible. Would plan primary repair at the same time.  Adin Hector, M.D., F.A.C.S. Gastrointestinal and Minimally Invasive Surgery [facility] [address], [place], [place] [address] [phone] Main / Paging

## 2014-10-05 DIAGNOSIS — Z08 Encounter for follow-up examination after completed treatment for malignant neoplasm: Secondary | ICD-10-CM | POA: Diagnosis not present

## 2014-10-05 DIAGNOSIS — Z85828 Personal history of other malignant neoplasm of skin: Secondary | ICD-10-CM | POA: Diagnosis not present

## 2014-10-05 DIAGNOSIS — L570 Actinic keratosis: Secondary | ICD-10-CM | POA: Diagnosis not present

## 2014-10-20 DIAGNOSIS — K439 Ventral hernia without obstruction or gangrene: Secondary | ICD-10-CM | POA: Diagnosis not present

## 2014-10-20 DIAGNOSIS — K4190 Unilateral femoral hernia, without obstruction or gangrene, not specified as recurrent: Secondary | ICD-10-CM | POA: Diagnosis not present

## 2014-10-20 DIAGNOSIS — K429 Umbilical hernia without obstruction or gangrene: Secondary | ICD-10-CM | POA: Diagnosis not present

## 2014-10-20 DIAGNOSIS — K4020 Bilateral inguinal hernia, without obstruction or gangrene, not specified as recurrent: Secondary | ICD-10-CM | POA: Diagnosis not present

## 2014-10-20 DIAGNOSIS — D176 Benign lipomatous neoplasm of spermatic cord: Secondary | ICD-10-CM | POA: Diagnosis not present

## 2014-11-20 ENCOUNTER — Ambulatory Visit (AMBULATORY_SURGERY_CENTER): Payer: Medicare Other | Admitting: Gastroenterology

## 2014-11-20 ENCOUNTER — Encounter: Payer: Self-pay | Admitting: Gastroenterology

## 2014-11-20 VITALS — BP 141/73 | HR 68 | Temp 98.6°F | Resp 29 | Ht 67.0 in | Wt 171.0 lb

## 2014-11-20 DIAGNOSIS — I10 Essential (primary) hypertension: Secondary | ICD-10-CM | POA: Diagnosis not present

## 2014-11-20 DIAGNOSIS — D123 Benign neoplasm of transverse colon: Secondary | ICD-10-CM | POA: Diagnosis not present

## 2014-11-20 DIAGNOSIS — K5730 Diverticulosis of large intestine without perforation or abscess without bleeding: Secondary | ICD-10-CM | POA: Diagnosis not present

## 2014-11-20 DIAGNOSIS — E119 Type 2 diabetes mellitus without complications: Secondary | ICD-10-CM | POA: Diagnosis not present

## 2014-11-20 DIAGNOSIS — K921 Melena: Secondary | ICD-10-CM

## 2014-11-20 MED ORDER — SODIUM CHLORIDE 0.9 % IV SOLN: 500.0000 mL | INTRAVENOUS | Status: DC

## 2014-11-20 NOTE — Patient Instructions (Addendum)
YOU HAD AN ENDOSCOPIC PROCEDURE TODAY AT THE [facility]:   Refer to the procedure report that was given to you for any specific questions about what was found during the examination.  If the procedure report does not answer your questions, please call your gastroenterologist to clarify.  If you requested that your care partner not be given the details of your procedure findings, then the procedure report has been included in a sealed envelope for you to review at your convenience later.  YOU SHOULD EXPECT: Some feelings of bloating in the abdomen. Passage of more gas than usual.  Walking can help get rid of the air that was put into your GI tract during the procedure and reduce the bloating. If you had a lower endoscopy (such as a colonoscopy or flexible sigmoidoscopy) you may notice spotting of blood in your stool or on the toilet paper. If you underwent a bowel prep for your procedure, you may not have a normal bowel movement for a few days.  Please Note:  You might notice some irritation and congestion in your nose or some drainage.  This is from the oxygen used during your procedure.  There is no need for concern and it should clear up in a day or so.  SYMPTOMS TO REPORT IMMEDIATELY:   Following lower endoscopy (colonoscopy or flexible sigmoidoscopy):  Excessive amounts of blood in the stool  Significant tenderness or worsening of abdominal pains  Swelling of the abdomen that is new, acute  Fever of 100F or higher     For urgent or emergent issues, a gastroenterologist can be reached at any hour by calling [phone].   DIET: Your first meal following the procedure should be a small meal and then it is ok to progress to your normal diet. Heavy or fried foods are harder to digest and may make you feel nauseous or bloated.  Likewise, meals heavy in dairy and vegetables can increase bloating.  Drink plenty of fluids but you should avoid alcoholic beverages for 24  hours.  ACTIVITY:  You should plan to take it easy for the rest of today and you should NOT DRIVE or use heavy machinery until tomorrow (because of the sedation medicines used during the test).    FOLLOW UP: Our staff will call the number listed on your records the next business day following your procedure to check on you and address any questions or concerns that you may have regarding the information given to you following your procedure. If we do not reach you, we will leave a message.  However, if you are feeling well and you are not experiencing any problems, there is no need to return our call.  We will assume that you have returned to your regular daily activities without incident.  If any biopsies were taken you will be contacted by phone or by letter within the next 1-3 weeks.  Please call us at [phone] if you have not heard about the biopsies in 3 weeks.    SIGNATURES/CONFIDENTIALITY: You and/or your care partner have signed paperwork which will be entered into your electronic medical record.  These signatures attest to the fact that that the information above on your After Visit Summary has been reviewed and is understood.  Full responsibility of the confidentiality of this discharge information lies with you and/or your care-partner.    INFORMATION ON POLYPS,DIVERTICULOSIS,&HIGH FIBER DIET GIVEN TO YOU TODAY 

## 2014-11-20 NOTE — Progress Notes (Signed)
Called to room to assist during endoscopic procedure.  Patient ID and intended procedure confirmed with present staff. Received instructions for my participation in the procedure from the performing physician.  

## 2014-11-20 NOTE — Progress Notes (Signed)
Transferred to recovery room. A/O x3, pleased with MAC.  VSS.  Report to Penny, RN. 

## 2014-11-20 NOTE — Op Note (Signed)
[place]  [address] [place], [address]   COLONOSCOPY PROCEDURE REPORT  PATIENT: Peter, Gross  MR#: [id number] BIRTHDATE: 08-15-1926 , 81  yrs. old GENDER: male ENDOSCOPIST: Inda Castle, MD REFERRED BE:[id number] Mckinley Jewel, M.D. PROCEDURE DATE:  11/20/2014 PROCEDURE:   Colonoscopy, diagnostic and Colonoscopy with cold biopsy polypectomy First Screening Colonoscopy - Avg.  risk and is 50 yrs.  old or older - No.  Prior Negative Screening - Now for repeat screening. 10 or more years since last screening  History of Adenoma - Now for follow-up colonoscopy & has been > or = to 3 yrs.  N/A  Polyps removed today? Yes ASA CLASS:   Class II INDICATIONS:Colorectal Neoplasm Risk Assessment for this procedure is average risk and hematochezia. MEDICATIONS: Monitored anesthesia care and Propofol 125 mg IV  DESCRIPTION OF PROCEDURE:   After the risks benefits and alternatives of the procedure were thoroughly explained, informed consent was obtained.  The digital rectal exam revealed no abnormalities of the rectum.   The LB CF-H180AL Loaner [id number] endoscope was introduced through the anus and advanced to the cecum, which was identified by both the appendix and ileocecal valve. No adverse events experienced.   The quality of the prep was (Suprep was used) excellent.  The instrument was then slowly withdrawn as the colon was fully examined. Estimated blood loss is zero unless otherwise noted in this procedure report.      COLON FINDINGS: A sessile polyp measuring 2 mm in size was found in the distal transverse colon.  A polypectomy was performed with cold forceps.   There was mild diverticulosis noted in the sigmoid colon.  Retroflexed views revealed no abnormalities. The time to cecum = 2.1 Withdrawal time = 7.6   The scope was withdrawn and the procedure completed. COMPLICATIONS: There were no immediate complications.  ENDOSCOPIC IMPRESSION: 1.    Sessile polyp was found in the distal transverse colon; polypectomy was performed with cold forceps 2.   Mild diverticulosis was noted in the sigmoid colon  limited rectal bleeding may have been hemorrhoidal  RECOMMENDATIONS: Given your age, you will not need another colonoscopy for colon cancer screening or polyp surveillance.  These types of tests usually stop around the age 81.  eSigned:  Inda Castle, MD 11/20/2014 3:23 PM   cc:   PATIENT NAME:  Peter, Gross MR#: [id number]

## 2014-11-21 ENCOUNTER — Telehealth: Payer: Self-pay | Admitting: *Deleted

## 2014-11-21 NOTE — Telephone Encounter (Signed)
  Follow up Call-  Call back number 11/20/2014  Post procedure Call Back phone  # [phone]  Permission to leave phone message Yes    Spoke with pt's wife, pt not available Patient questions:  Do you have a fever, pain , or abdominal swelling? No. Pain Score  0 *  Have you tolerated food without any problems? Yes.    Have you been able to return to your normal activities? Yes.    Do you have any questions about your discharge instructions: Diet   No. Medications  No. Follow up visit  No.  Do you have questions or concerns about your Care? No.  Actions: * If pain score is 4 or above: No action needed, pain <4.

## 2014-11-24 ENCOUNTER — Encounter: Payer: Self-pay | Admitting: Gastroenterology

## 2014-12-12 DIAGNOSIS — H2513 Age-related nuclear cataract, bilateral: Secondary | ICD-10-CM | POA: Diagnosis not present

## 2014-12-12 DIAGNOSIS — H52203 Unspecified astigmatism, bilateral: Secondary | ICD-10-CM | POA: Diagnosis not present

## 2014-12-12 DIAGNOSIS — H353132 Nonexudative age-related macular degeneration, bilateral, intermediate dry stage: Secondary | ICD-10-CM | POA: Diagnosis not present

## 2014-12-12 DIAGNOSIS — Z23 Encounter for immunization: Secondary | ICD-10-CM | POA: Diagnosis not present

## 2014-12-26 ENCOUNTER — Other Ambulatory Visit: Payer: Self-pay | Admitting: Internal Medicine

## 2014-12-26 MED ORDER — TELMISARTAN 20 MG PO TABS: ORAL_TABLET | ORAL | Status: DC

## 2014-12-28 DIAGNOSIS — H2511 Age-related nuclear cataract, right eye: Secondary | ICD-10-CM | POA: Diagnosis not present

## 2014-12-28 DIAGNOSIS — H25811 Combined forms of age-related cataract, right eye: Secondary | ICD-10-CM | POA: Diagnosis not present

## 2015-01-18 DIAGNOSIS — H2511 Age-related nuclear cataract, right eye: Secondary | ICD-10-CM | POA: Diagnosis not present

## 2015-01-18 DIAGNOSIS — H25812 Combined forms of age-related cataract, left eye: Secondary | ICD-10-CM | POA: Diagnosis not present

## 2015-01-18 DIAGNOSIS — H2512 Age-related nuclear cataract, left eye: Secondary | ICD-10-CM | POA: Diagnosis not present

## 2015-02-21 ENCOUNTER — Other Ambulatory Visit: Payer: Self-pay | Admitting: Internal Medicine

## 2015-03-26 DIAGNOSIS — N401 Enlarged prostate with lower urinary tract symptoms: Secondary | ICD-10-CM | POA: Diagnosis not present

## 2015-03-26 DIAGNOSIS — C61 Malignant neoplasm of prostate: Secondary | ICD-10-CM | POA: Diagnosis not present

## 2015-03-26 DIAGNOSIS — N138 Other obstructive and reflux uropathy: Secondary | ICD-10-CM | POA: Diagnosis not present

## 2015-03-26 DIAGNOSIS — N5201 Erectile dysfunction due to arterial insufficiency: Secondary | ICD-10-CM | POA: Diagnosis not present

## 2015-04-19 ENCOUNTER — Other Ambulatory Visit: Payer: Self-pay | Admitting: Internal Medicine

## 2015-05-07 ENCOUNTER — Ambulatory Visit (INDEPENDENT_AMBULATORY_CARE_PROVIDER_SITE_OTHER): Payer: Medicare Other | Admitting: Internal Medicine

## 2015-05-07 ENCOUNTER — Encounter: Payer: Self-pay | Admitting: Internal Medicine

## 2015-05-07 VITALS — BP 124/76 | HR 78 | Ht 68.0 in | Wt 180.4 lb

## 2015-05-07 DIAGNOSIS — I10 Essential (primary) hypertension: Secondary | ICD-10-CM | POA: Diagnosis not present

## 2015-05-07 DIAGNOSIS — J449 Chronic obstructive pulmonary disease, unspecified: Secondary | ICD-10-CM | POA: Diagnosis not present

## 2015-05-07 DIAGNOSIS — E119 Type 2 diabetes mellitus without complications: Secondary | ICD-10-CM

## 2015-05-07 NOTE — Progress Notes (Signed)
Subjective:    Patient ID: Peter Gross, male    DOB: Oct 05, 1926.   MRN: [id number]    Brief patient profile:  86 yowm quit smoking 1980 with HBP and chronic nonprogressive doe and PFT's 11/16/2006 FEV1 112% but ratio 57%  And  developed aodm II controlled with diet since 2008, no overt symptoms followed by Peter Gross as primary care pt    History of Present Illness  November 09, 2008 comprehensive eval  ov for bp check with no tia or claudicaiton symptoms, cbg's typically < 120, no sob though avoiding ex due to heat plays golf> no change rx    06/23/2012 f/u ov/Peter Gross re muliple dx's as above plus new HB Chief Complaint  Patient presents with  . Follow-up    Pt c/o heart burn x 3 months, worse at night and some during the day.   symptoms mod severe, ? Started p taking aleve in pms after supper for arthritis, not taking prevacid approp, no dysphagia, no ex cp or sob/ diaphoresis, symptoms 4-5/7 nights a week  rec Either pepcid ac 20 mg as needed or use prevacid 15-30 mg 30-60 min before supper  Only take the aleve with meals as needed  GERD diet   12/26/2013 f/u ov/Peter Gross re: comprehensive eval / hbp, borderline dm, gerd, djd/pvd Chief Complaint  Patient presents with  . Annual Exam    Pt is fasting. He c/o heartburn recently- tums help some.   not using prevacid as rec  Not limited by breathing from desired activities  Nor by cp, claudication Not no inhalers at all  rec Prevnar today  Prevacid or prilosec Take 30-60 min before first meal of the day  GERD diet    06/26/2014 f/u ov/Peter Gross re: hbp, borderline dm, gerd, djd/pvd Chief Complaint  Patient presents with  . Follow-up    Pt states doing well and denies any new co's today.  Not limited by breathing from desired activities  rec Please remember to go to the lab   department downstairs for your tests - we will call you with the results when they are available. No need to check sugars more than once a week and only fasting  (before bfast) and let me know if over 140 consistently      05/07/2015  f/u ov/Peter Gross re: borderline dm /hbp / djd/ gerd/ pvd  Chief Complaint  Patient presents with  . Annual Exam    Pt is not fasting. He states doing well and denies any co's today.   Not limited by breathing from desired activities  - very sedentary, piddles in garden   No obvious day to day or daytime variabilty or assoc chronic cough or cp or chest tightness, subjective wheeze overt sinus or hb symptoms. No unusual exp hx or h/o childhood pna/ asthma or knowledge of premature birth.  Sleeping ok without nocturnal  or early am exacerbation  of respiratory  c/o's or need for noct saba. Also denies any obvious fluctuation of symptoms with weather or environmental changes or other aggravating or alleviating factors except as outlined above   Current Medications, Allergies, Complete Past Medical History, Past Surgical History, Family History, and Social History were reviewed in [employer] record.  ROS  The following are not active complaints unless bolded sore throat, dysphagia, dental problems, itching, sneezing,  nasal congestion or excess/ purulent secretions, ear ache,   fever, chills, sweats, unintended wt loss, pleuritic or exertional cp, hemoptysis,  orthopnea pnd or leg swelling,  presyncope, palpitations, heartburn, abdominal pain, anorexia, nausea, vomiting, diarrhea  or change in bowel or urinary habits, change in stools or urine, dysuria,hematuria,  rash, arthralgias, visual complaints, headache, numbness weakness or ataxia or problems with walking or coordination,  change in mood/affect or memory.                Past Medical History:  Obesity  - Target wt = 171 for BMI < 26  HYPERTENSION (ICD-401.9)  - neg cardiolyte 02/2005  DM (ICD-250.00)  - dx 09/2006, diet contolled  BENIGN PROSTATIC HYPERTROPHY/elevated psa  ...................[name]  Hx of peptic ulcer disease  DIVERTICULITIS, HX  OF (ICD-V12.79..............................................Marland KitchenDeatra Ina  - colonoscopy 11/08/02  PVD (ICD-443.9)  - decreased  pedal pulses, asymptomatic  COPD  GOLD I - PFT's 11/16/06 FEV1 3.0 (112%) with ratio 57 % and no rev, nl dlco  Health Maintenance............................................................................[name]  - Td 2006,  Tdap  2013  - Pneumovax 2004, prevnar 12/26/2013  - CPX 12/26/2013     Family History:  HBP mother  Dementia / ? cva Father  2 siblings healthy one older sister, one younger brother No FH of Colon Cancer   Social History:  Retired--[employer], still active  Married  3 Childern  Quit smoking 1980  No ETOH  Daily Caffeine Use: 2 daily           Objective:   Physical Exam   Robust wm, alert and approp ambulatory wm nad  wt 187 October 29, 2007 > 188 May 08, 2009 > 188 May 09, 2009 > 181 October 08, 2009 > 176 11/01/2010 > 04/16/2011 183 >  12/18/2011  176 > 06/23/2012  180 > 176 12/21/2012 > 12/26/2013 180  > 06/26/14  177 > 05/07/2015 180  HEENT:dention ok with lower dentures, upper caps nl turbinates, and orophanx.  Neck without JVD/Nodes/TM  Lungs clear to A and P bilaterally without cough on insp or exp maneuvers, mild pectus excavatum and barrel chest  RRR no s3 or murmur or increase in P2. no edema. Pulses in feet  reduced bilaterally Abd soft and benign with nl excursion in the supine position.  Ext warm without calf tenderness, cyanosis clubbing.  Neuro: intact sensorium, no motor or cerebellar def or path reflexes MS Nl gait, no deformities or joint restrictions                   Assessment & Plan:

## 2015-05-07 NOTE — Assessment & Plan Note (Addendum)
cbg's reviewed, have been as high as 160 fasting assoc with wt gain, inactivity  rec more regular aerobic activity/ no change in rx  Needs cpx fasting 3 m  I had an extended discussion with the patient reviewing all relevant studies completed to date and  lasting 15 to 20 minutes of a 25 minute visit    Each maintenance medication was reviewed in detail including most importantly the difference between maintenance and prns and under what circumstances the prns are to be triggered using an action plan format that is not reflected in the computer generated alphabetically organized AVS.    Please see instructions for details which were reviewed in writing and the patient given a copy highlighting the part that I personally wrote and discussed at today's ov.

## 2015-05-07 NOTE — Assessment & Plan Note (Signed)
-   PFT's 11/16/06 FEV1 3.0 (112%) with ratio 57 % and no rev, nl dlco   No exac, cough or limiting sob so no need to escalate rx

## 2015-05-07 NOTE — Assessment & Plan Note (Signed)
Adequate control on present rx, reviewed > no change in rx needed   

## 2015-05-07 NOTE — Patient Instructions (Addendum)
To get the most out of exercise, you need to be continuously aware that you are short of breath, but never out of breath, for 30 minutes daily. As you improve, it will actually be easier for you to do the same amount of exercise  in  30 minutes so always push to the level where you are short of breath.    Please schedule a follow up visit in 3 months but call sooner if needed

## 2015-05-11 ENCOUNTER — Other Ambulatory Visit: Payer: Self-pay | Admitting: Internal Medicine

## 2015-08-09 ENCOUNTER — Encounter: Payer: Self-pay | Admitting: Internal Medicine

## 2015-08-09 ENCOUNTER — Other Ambulatory Visit (INDEPENDENT_AMBULATORY_CARE_PROVIDER_SITE_OTHER): Payer: Medicare Other

## 2015-08-09 ENCOUNTER — Ambulatory Visit (INDEPENDENT_AMBULATORY_CARE_PROVIDER_SITE_OTHER): Payer: Medicare Other | Admitting: Internal Medicine

## 2015-08-09 VITALS — BP 112/62 | HR 72 | Ht 67.0 in | Wt 173.0 lb

## 2015-08-09 DIAGNOSIS — I10 Essential (primary) hypertension: Secondary | ICD-10-CM

## 2015-08-09 DIAGNOSIS — R12 Heartburn: Secondary | ICD-10-CM | POA: Diagnosis not present

## 2015-08-09 DIAGNOSIS — E119 Type 2 diabetes mellitus without complications: Secondary | ICD-10-CM

## 2015-08-09 DIAGNOSIS — J449 Chronic obstructive pulmonary disease, unspecified: Secondary | ICD-10-CM

## 2015-08-09 LAB — BASIC METABOLIC PANEL
BUN: 12 mg/dL (ref 6–23)
CHLORIDE: 102 meq/L (ref 96–112)
CO2: 30 mEq/L (ref 19–32)
Calcium: 9.8 mg/dL (ref 8.4–10.5)
Creatinine, Ser: 0.94 mg/dL (ref 0.40–1.50)
GFR: 80.62 mL/min (ref >60.00)
GLUCOSE: 180 mg/dL — AB (ref 70–99)
POTASSIUM: 4.8 meq/L (ref 3.5–5.1)
SODIUM: 138 meq/L (ref 135–145)

## 2015-08-09 LAB — HEMOGLOBIN A1C: HEMOGLOBIN A1C: 6.3 % (ref 4.6–6.5)

## 2015-08-09 MED ORDER — FAMOTIDINE 20 MG PO TABS: ORAL_TABLET | ORAL | Status: DC

## 2015-08-09 NOTE — Patient Instructions (Signed)
Pepcid ac 20 mg take after meals up to twice daily as needed for heartburn but if not working as well then you should restart prevacid 30 mg Take 30-60 min before first meal of the day automatically, not as needed  GERD (REFLUX)  is an extremely common cause of respiratory symptoms just like yours , many times with no obvious heartburn at all.    It can be treated with medication, but also with lifestyle changes including elevation of the head of your bed (ideally with 6 inch  bed blocks),  Smoking cessation, avoidance of late meals, excessive alcohol, and avoid fatty foods, chocolate, peppermint, colas, red wine, and acidic juices such as orange juice.  NO MINT OR MENTHOL PRODUCTS SO NO COUGH DROPS  USE SUGARLESS CANDY INSTEAD (Jolley ranchers or Stover's or Life Savers) or even ice chips will also do - the key is to swallow to prevent all throat clearing. NO OIL BASED VITAMINS - use powdered substitutes.    Please remember to go to the lab   department downstairs for your tests - we will call you with the results when they are available.   Please schedule a follow up visit in 6 months but call sooner if needed

## 2015-08-09 NOTE — Progress Notes (Signed)
Subjective:    Patient ID: Peter Gross, male    DOB: 16-Feb-1927.   MRN: [id number]    Brief patient profile:  89   yowm quit smoking 1980 with HBP and chronic nonprogressive doe and PFT's 11/16/2006 FEV1 112% but ratio 57%  And  developed aodm II controlled with diet since 2008, no overt symptoms followed by Peter Gross as primary care pt    History of Present Illness  05/07/2015  f/u ov/Peter Gross re: borderline dm /hbp / djd/ gerd/ pvd/ GOLD I copd  Chief Complaint  Patient presents with  . Annual Exam    Pt is not fasting. He states doing well and denies any co's today.   Not limited by breathing from desired activities  - very sedentary, piddles in garden  rec No change rx   08/09/2015  f/u ov/Peter Gross re: borderline dm/ hbp/ djd/bph /GOLD I copd  Chief Complaint  Patient presents with  . Follow-up    Pt states doing well and no new co's today.    doe = MMRC1 = can walk nl pace, flat grade, can't hurry or go uphills or steps s sob   Main issue is intermittent HB takes prevacid otc prn helps some/ symptoms variable depending on what he eats x 6 m    No obvious day to day or daytime variabilty or assoc chronic cough or cp or chest tightness, subjective wheeze overt sinus  symptoms. No unusual exp hx or h/o childhood pna/ asthma or knowledge of premature birth.  Sleeping ok without nocturnal  or early am exacerbation  of respiratory  c/o's or need for noct saba. Also denies any obvious fluctuation of symptoms with weather or environmental changes or other aggravating or alleviating factors except as outlined above   Current Medications, Allergies, Complete Past Medical History, Past Surgical History, Family History, and Social History were reviewed in [facility] record.  ROS  The following are not active complaints unless bolded sore throat, dysphagia, dental problems, itching, sneezing,  nasal congestion or excess/ purulent secretions, ear ache,   fever, chills,  sweats, unintended wt loss, pleuritic or exertional cp, hemoptysis,  orthopnea pnd or leg swelling, presyncope, palpitations, heartburn, abdominal pain, anorexia, nausea, vomiting, diarrhea  or change in bowel or urinary habits, change in stools or urine, dysuria,hematuria,  rash, arthralgias, visual complaints, headache, numbness weakness or ataxia or problems with walking or coordination,  change in mood/affect or memory.                Past Medical History:  Obesity  - Target wt = 171 for BMI < 26  HYPERTENSION (ICD-401.9)  - neg cardiolyte 02/2005  DM (ICD-250.00)  - dx 09/2006, diet contolled  BENIGN PROSTATIC HYPERTROPHY/elevated psa  ...................[name]  Hx of peptic ulcer disease  DIVERTICULITIS, HX OF (ICD-V12.79..............................................Marland KitchenDeatra Gross  - colonoscopy 11/08/02  PVD (ICD-443.9)  - decreased  pedal pulses, asymptomatic  COPD  GOLD I - PFT's 11/16/06 FEV1 3.0 (112%) with ratio 57 % and no rev, nl dlco  Health Maintenance............................................................................[name]  - Td 2006,  Tdap  2013  - Pneumovax 2004, prevnar 12/26/2013  - CPX 12/26/2013     Family History:  HBP mother  Dementia / ? cva Father  2 siblings healthy one older sister, one younger brother No FH of Colon Cancer   Social History:  Engineer, petroleum, still active  Married  3 Childern  Quit smoking 1980  No ETOH  Daily Caffeine Use: 2 daily  Objective:   Physical Exam   Robust wm, alert and approp ambulatory wm nad  wt 187 October 29, 2007 > 188 May 08, 2009 > 188 May 09, 2009 > 181 October 08, 2009 > 176 11/01/2010 > 04/16/2011 183 >  12/18/2011  176 > 06/23/2012  180 > 176 12/21/2012 > 12/26/2013 180  > 06/26/14  177 > 05/07/2015 180 > 08/09/2015   173   HEENT:dention ok with lower dentures, upper caps nl turbinates, and orophanx.  Neck without JVD/Nodes/TM  Lungs clear to A and P bilaterally without  cough on insp or exp maneuvers, mild pectus excavatum and barrel chest  RRR no s3 or murmur or increase in P2. no edema. Pulses in feet  reduced bilaterally Abd soft and benign with nl excursion in the supine position.  Ext warm without calf tenderness, cyanosis clubbing.  Neuro: intact sensorium, no motor or cerebellar def or path reflexes MS Nl gait, no deformities or joint restrictions      Labs ordered/ reviewed:      Chemistry      Component Value Date/Time   NA 138 08/09/2015 0926   K 4.8 08/09/2015 0926   CL 102 08/09/2015 0926   CO2 30 08/09/2015 0926   BUN 12 08/09/2015 0926   CREATININE 0.94 08/09/2015 0926      Component Value Date/Time   CALCIUM 9.8 08/09/2015 0926   ALKPHOS 59 12/21/2012 0946   AST 19 12/21/2012 0946   ALT 17 12/21/2012 0946   BILITOT 1.1 12/21/2012 0946          Lab Results  Component Value Date   HGBA1C 6.3 08/09/2015   HGBA1C 6.4 06/26/2014   HGBA1C 6.5 12/26/2013               Assessment & Plan:

## 2015-08-10 NOTE — Progress Notes (Signed)
Quick Note:  Spoke with the pt's spouse and notified of results and she verbalized understanding and will inform the pt ______

## 2015-08-13 ENCOUNTER — Encounter: Payer: Self-pay | Admitting: Internal Medicine

## 2015-08-13 NOTE — Assessment & Plan Note (Signed)
Adequate control on present rx, reviewed > no change in rx needed   

## 2015-08-13 NOTE — Assessment & Plan Note (Signed)
-   PFT's 11/16/06 FEV1 3.0 (112%) with ratio 57 % and no rev, nl dlco   Not really limited by doe/ no tendency to aecopd> no rx needed

## 2015-08-13 NOTE — Assessment & Plan Note (Signed)
Adequate control on present rx, reviewed > no change in rx needed  = diet/ reviewed

## 2015-08-13 NOTE — Assessment & Plan Note (Signed)
Intermittent problem better addressed by either using prn h2 or resuming ppi  Discussed the recent press about ppi's in the context of a statistically significant (but questionably clinically relevant) increase in CRI in pts on ppi vs h2's > bottom line is the lowest dose of ppi that controls   gerd is the right dose and if that dose is zero that's fine esp since h2's are cheaper.      I had an extended discussion with the patient reviewing all relevant studies completed to date and  lasting 15 to 20 minutes of a 25 minute visit    Each maintenance medication was reviewed in detail including most importantly the difference between maintenance and prns and under what circumstances the prns are to be triggered using an action plan format that is not reflected in the computer generated alphabetically organized AVS.    Please see instructions for details which were reviewed in writing and the patient given a copy highlighting the part that I personally wrote and discussed at today's ov.

## 2015-09-24 DIAGNOSIS — C61 Malignant neoplasm of prostate: Secondary | ICD-10-CM | POA: Diagnosis not present

## 2015-10-06 IMAGING — DX DG ABDOMEN ACUTE W/ 1V CHEST
3 series · 4 of 4 positions shown · non-contrast
Comparison: December 26, 2013

CLINICAL DATA: Rectal bleeding.

EXAM:
DG ABDOMEN ACUTE W/ 1V CHEST

[chest pa]
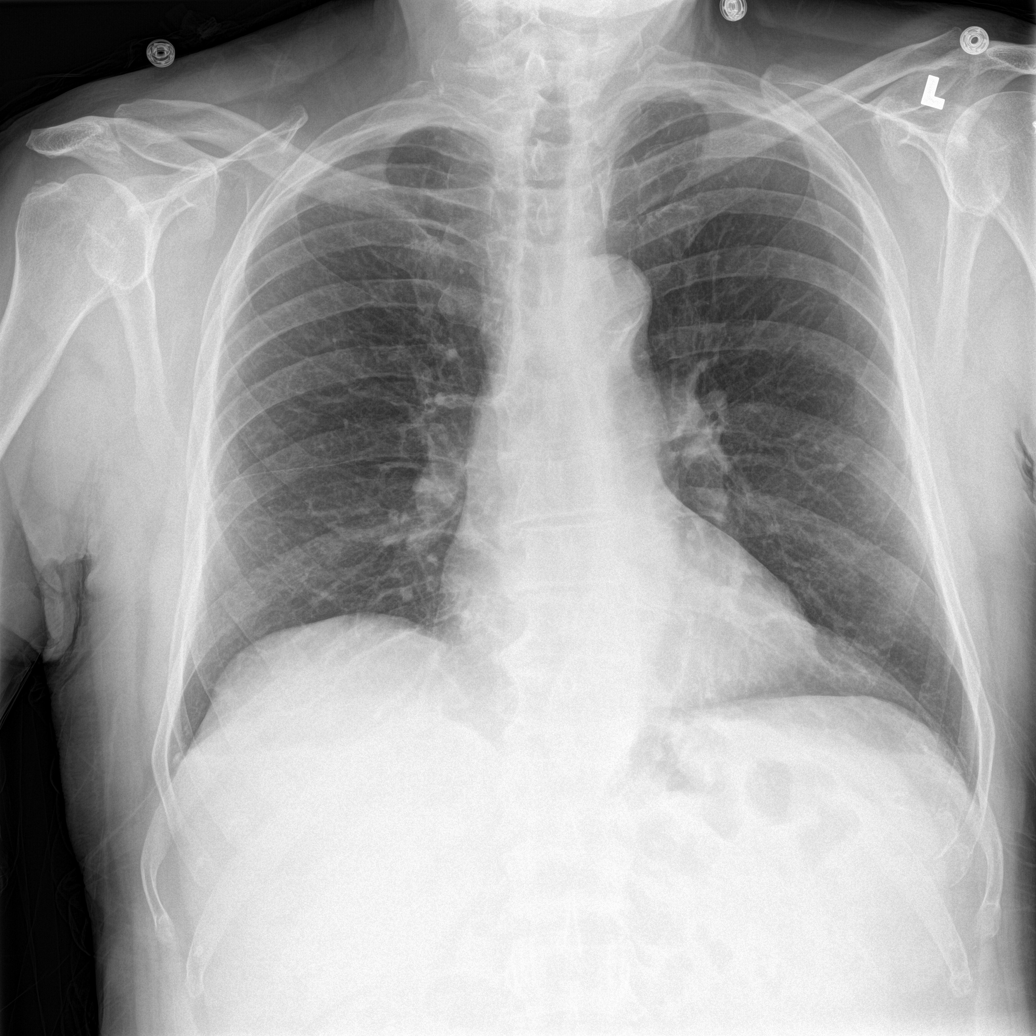

[Series 2: abdomen erect · 0.14mm/px · 2 of 2 slices shown]
[im 1/2]
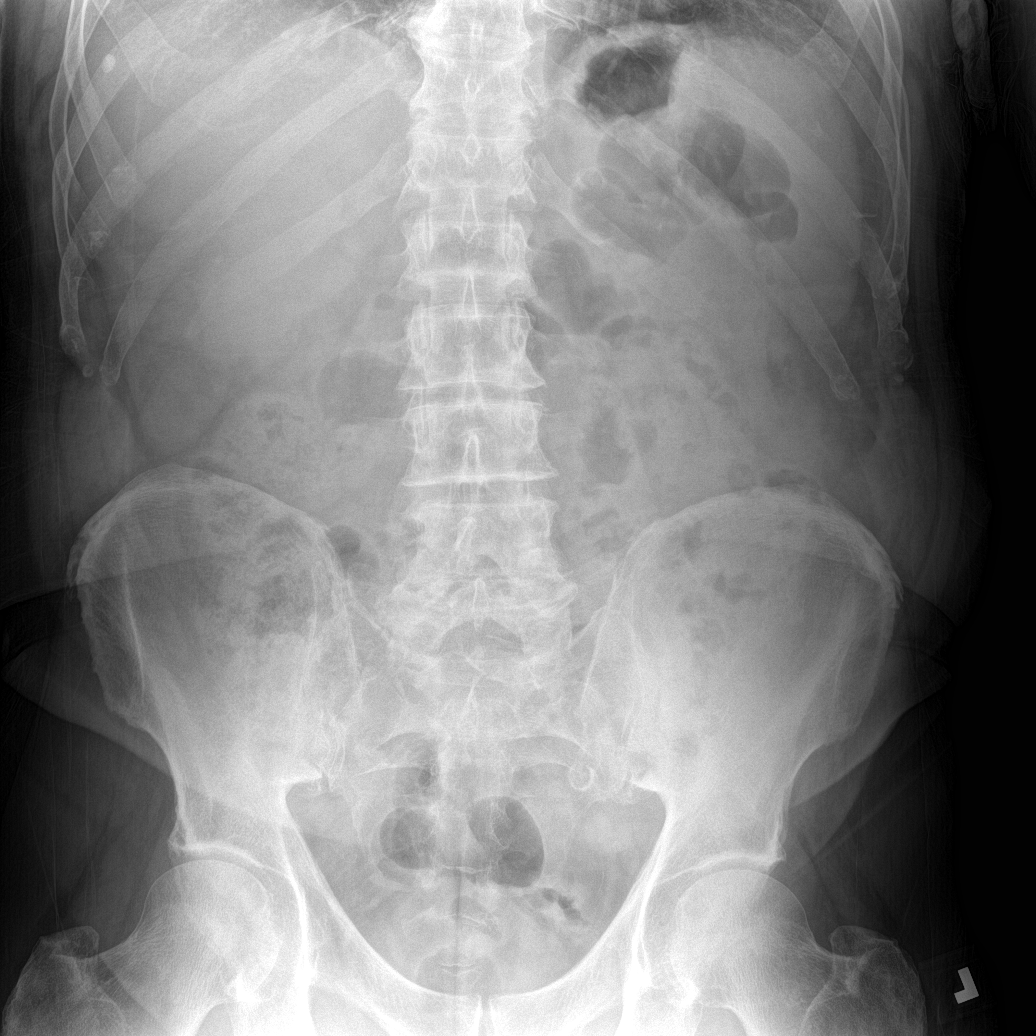
[im 2/2]
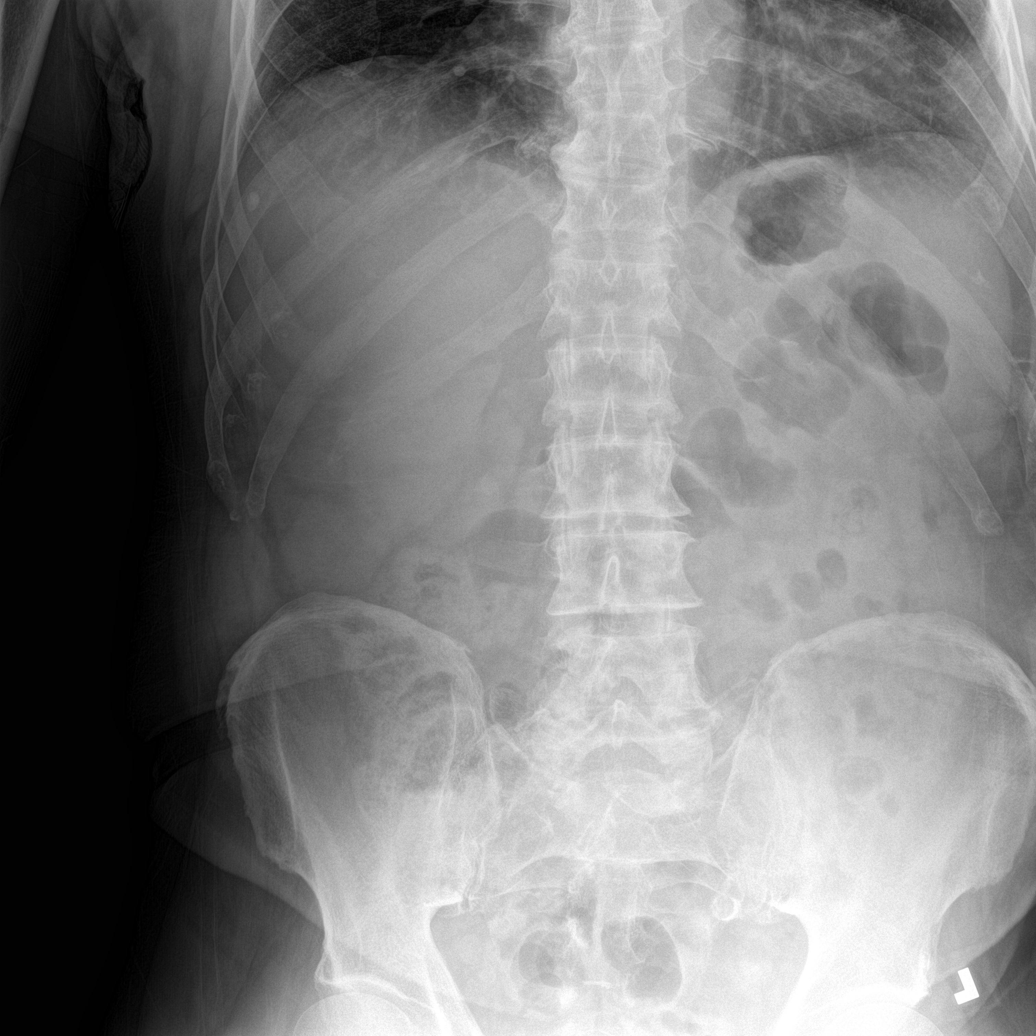

[abdomen supine]
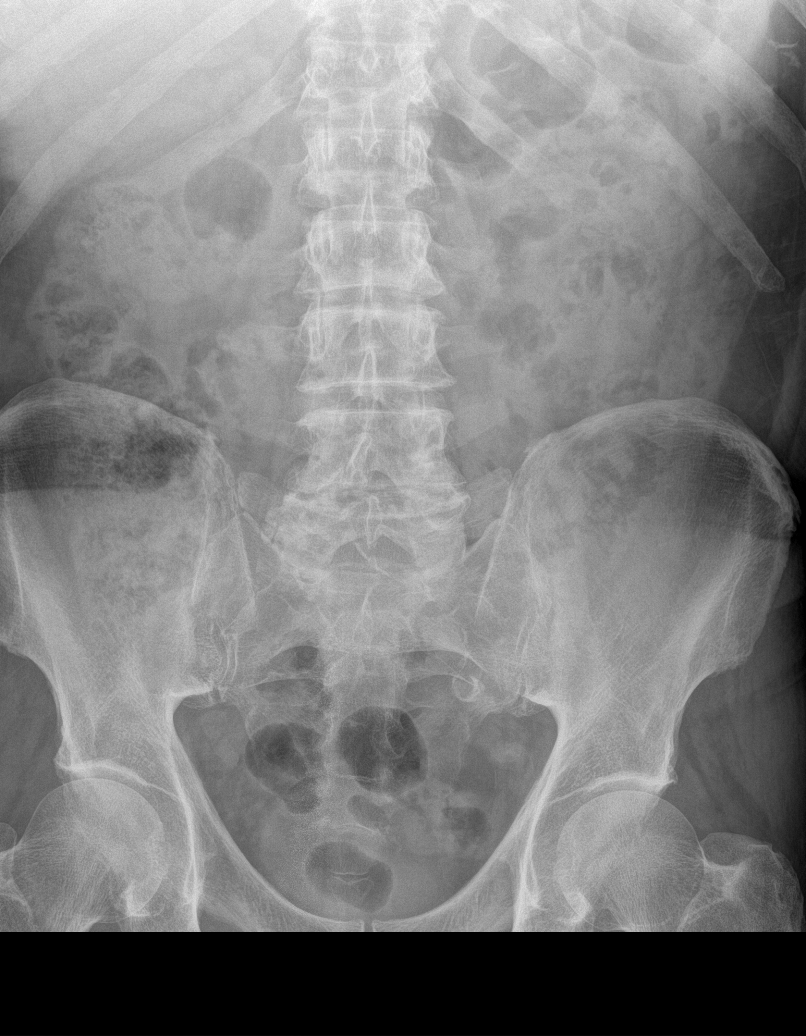

[4 of 4 positions shown; findings below may reference images not displayed]

FINDINGS: There is no evidence of dilated bowel loops or free intraperitoneal
air. No radiopaque calculi or other significant radiographic
abnormality is seen. Heart size and mediastinal contours are within
normal limits. Both lungs are clear.
IMPRESSION: Negative abdominal radiographs.  No acute cardiopulmonary disease.

## 2015-10-27 ENCOUNTER — Other Ambulatory Visit: Payer: Self-pay | Admitting: Internal Medicine

## 2016-02-08 ENCOUNTER — Encounter: Payer: Self-pay | Admitting: Internal Medicine

## 2016-02-08 ENCOUNTER — Other Ambulatory Visit (INDEPENDENT_AMBULATORY_CARE_PROVIDER_SITE_OTHER): Payer: Medicare Other

## 2016-02-08 ENCOUNTER — Ambulatory Visit (INDEPENDENT_AMBULATORY_CARE_PROVIDER_SITE_OTHER): Payer: Medicare Other | Admitting: Internal Medicine

## 2016-02-08 VITALS — BP 118/68 | HR 65 | Ht 68.0 in | Wt 173.0 lb

## 2016-02-08 DIAGNOSIS — J449 Chronic obstructive pulmonary disease, unspecified: Secondary | ICD-10-CM | POA: Diagnosis not present

## 2016-02-08 DIAGNOSIS — E119 Type 2 diabetes mellitus without complications: Secondary | ICD-10-CM | POA: Diagnosis not present

## 2016-02-08 DIAGNOSIS — I10 Essential (primary) hypertension: Secondary | ICD-10-CM

## 2016-02-08 DIAGNOSIS — N401 Enlarged prostate with lower urinary tract symptoms: Secondary | ICD-10-CM | POA: Diagnosis not present

## 2016-02-08 DIAGNOSIS — N138 Other obstructive and reflux uropathy: Secondary | ICD-10-CM

## 2016-02-08 LAB — BASIC METABOLIC PANEL
BUN: 14 mg/dL (ref 6–23)
CALCIUM: 10 mg/dL (ref 8.4–10.5)
CO2: 28 mEq/L (ref 19–32)
Chloride: 104 mEq/L (ref 96–112)
Creatinine, Ser: 0.9 mg/dL (ref 0.40–1.50)
GFR: 84.67 mL/min (ref >60.00)
Glucose, Bld: 108 mg/dL — ABNORMAL HIGH (ref 70–99)
POTASSIUM: 4.9 meq/L (ref 3.5–5.1)
SODIUM: 141 meq/L (ref 135–145)

## 2016-02-08 LAB — HEMOGLOBIN A1C: Hgb A1c MFr Bld: 6.3 % (ref 4.6–6.5)

## 2016-02-08 NOTE — Progress Notes (Signed)
Subjective:    Patient ID: Peter Gross, male    DOB: Dec 29, 1926.   MRN: [id number]    Brief patient profile:  89   yowm quit smoking 1980 with HBP and chronic nonprogressive doe and PFT's 11/16/2006 FEV1 112% but ratio 57%  And  developed aodm II controlled with diet since 2008, no overt symptoms followed by [name] as primary care pt    History of Present Illness  05/07/2015  f/u ov/Camber Ninh re: borderline dm /hbp / djd/ gerd/ pvd/ GOLD I copd  Chief Complaint  Patient presents with  . Annual Exam    Pt is not fasting. He states doing well and denies any co's today.   Not limited by breathing from desired activities  - very sedentary, piddles in garden  rec No change rx   08/09/2015  f/u ov/Anaiz Qazi re: borderline dm/ hbp/ djd/bph /GOLD I copd  Chief Complaint  Patient presents with  . Follow-up    Pt states doing well and no new co's today.    doe = MMRC1 = can walk nl pace, flat grade, can't hurry or go uphills or steps s sob   Main issue is intermittent HB takes prevacid otc prn helps some/ symptoms variable depending on what he eats x 6 m rec Pepcid ac 20 mg take after meals up to twice daily as needed for heartburn but if not working as well then you should restart prevacid 30 mg Take 30-60 min before first meal of the day automatically, not as needed GERD diet     02/08/2016  f/u ov/Sundra Haddix re: borderline dm/ hbp/ djd/ bph copd I copd but no rx for it Chief Complaint  Patient presents with  . Follow-up    Doing well and denies any co's today.    Not limited by breathing from desired activities     No obvious day to day or daytime variabilty or assoc chronic cough or cp or chest tightness, subjective wheeze overt sinus  symptoms. No unusual exp hx or h/o childhood pna/ asthma or knowledge of premature birth.  Sleeping ok without nocturnal  or early am exacerbation  of respiratory  c/o's or need for noct saba. Also denies any obvious fluctuation of symptoms with weather or  environmental changes or other aggravating or alleviating factors except as outlined above   Current Medications, Allergies, Complete Past Medical History, Past Surgical History, Family History, and Social History were reviewed in [facility] record.  ROS  The following are not active complaints unless bolded sore throat, dysphagia, dental problems, itching, sneezing,  nasal congestion or excess/ purulent secretions, ear ache,   fever, chills, sweats, unintended wt loss, pleuritic or exertional cp, hemoptysis,  orthopnea pnd or leg swelling, presyncope, palpitations, heartburn, abdominal pain, anorexia, nausea, vomiting, diarrhea  or change in bowel or urinary habits, change in stools or urine, dysuria,hematuria,  rash, arthralgias, visual complaints, headache, numbness weakness or ataxia or problems with walking or coordination,  change in mood/affect or memory.                Past Medical History:  Obesity  - Target wt = 171 for BMI < 26  HYPERTENSION (ICD-401.9)  - neg cardiolyte 02/2005  DM (ICD-250.00)  - dx 09/2006, diet contolled  BENIGN PROSTATIC HYPERTROPHY/elevated psa  ...................[name]  Hx of peptic ulcer disease  DIVERTICULITIS, HX OF (ICD-V12.79..............................................Marland KitchenDeatra Ina  - colonoscopy 11/08/02  PVD (ICD-443.9)  - decreased  pedal pulses, asymptomatic  COPD  GOLD I -  PFT's 11/16/06 FEV1 3.0 (112%) with ratio 57 % and no rev, nl dlco  Health Maintenance............................................................................[name]  - Td 2006,  Tdap  2013  - Pneumovax 2004, prevnar 12/26/2013  - CPX 12/26/2013     Family History:  HBP mother  Dementia / ? 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  Neck without JVD/Nodes/TM  Lungs clear to A and P bilaterally without cough on insp or exp maneuvers, mild pectus excavatum and barrel chest  RRR no s3 or murmur or increase in P2. no edema. Pulses in feet  reduced bilaterally Abd soft and benign with nl excursion in the supine position.  Ext warm without calf tenderness, cyanosis clubbing.  Neuro: intact sensorium, no motor or cerebellar def or path reflexes MS Nl gait, no deformities or joint restrictions        Labs ordered/ reviewed:      Chemistry      Component Value Date/Time   NA 141 02/08/2016 0916   K 4.9 02/08/2016 0916   CL 104 02/08/2016 0916   CO2 28 02/08/2016 0916   BUN 14 02/08/2016 0916   CREATININE 0.90 02/08/2016 0916      Component Value Date/Time   CALCIUM 10.0 02/08/2016 0916   ALKPHOS 59 12/21/2012 0946   AST 19 12/21/2012 0946   ALT 17 12/21/2012 0946   BILITOT 1.1 12/21/2012 0946        Lab Results  Component Value Date   WBC 10.3 07/22/2014   HGB 14.4 07/22/2014   HCT 41.7 07/22/2014   MCV 91.2 07/22/2014   PLT 166 07/22/2014       Lab Results  Component Value Date   TSH 0.79 12/26/2013       Lab Results  Component Value Date   HGBA1C 6.3 02/08/2016   HGBA1C 6.3 08/09/2015   HGBA1C 6.4 06/26/2014            Assessment & Plan:

## 2016-02-08 NOTE — Patient Instructions (Signed)
Please remember to go to the lab department downstairs for your tests - we will call you with the results when they are available.  Please schedule a follow up visit in 6 months but call sooner if needed / fasting on return for cpx

## 2016-02-08 NOTE — Progress Notes (Signed)
Spoke with pt and notified of results per Dr. Wert. Pt verbalized understanding and denied any questions. 

## 2016-02-10 ENCOUNTER — Encounter: Payer: Self-pay | Admitting: Internal Medicine

## 2016-02-10 NOTE — Assessment & Plan Note (Signed)
-   PFT's 11/16/06 FEV1 3.0 (112%) with ratio 57 % and no rev, nl dlco   As I explained to this patient in detail:  although there may be copd present, it may not be clinically relevant:   it does not appear to be limiting activity tolerance  So  I don't recommend aggressive pulmonary rx at this point unless limiting symptoms arise or acute exacerbations become as issue, neither of which is the case now.  I asked the patient to contact this office at any time in the future should either of these problems arise.

## 2016-02-10 NOTE — Assessment & Plan Note (Signed)
Followed by Urology/ Dr Keene Breath on flomax/ proscar   Adequate control on present rx, reviewed in detail with pt > no change in rx needed  > f/u by Dr Jeffie Pollock scheduled/reinforced

## 2016-02-10 NOTE — Assessment & Plan Note (Signed)
Adequate control on present rx, reviewed in detail with pt > no change in rx needed = diet/ ex no meds  

## 2016-02-10 NOTE — Assessment & Plan Note (Signed)
Lab Results  Component Value Date   CREATININE 0.90 02/08/2016   CREATININE 0.94 08/09/2015   CREATININE 0.84 07/22/2014    Adequate control on present rx, reviewed in detail with pt > no change in rx needed

## 2016-02-20 ENCOUNTER — Other Ambulatory Visit: Payer: Self-pay | Admitting: Internal Medicine

## 2016-08-08 ENCOUNTER — Encounter: Payer: Self-pay | Admitting: Internal Medicine

## 2016-08-08 ENCOUNTER — Ambulatory Visit (INDEPENDENT_AMBULATORY_CARE_PROVIDER_SITE_OTHER)
Admission: RE | Admit: 2016-08-08 | Discharge: 2016-08-08 | Disposition: A | Discharge status: Home / Self Care | Payer: Medicare Other | Source: Ambulatory Visit | Attending: Internal Medicine | Admitting: Internal Medicine

## 2016-08-08 ENCOUNTER — Ambulatory Visit (INDEPENDENT_AMBULATORY_CARE_PROVIDER_SITE_OTHER): Payer: Medicare Other | Admitting: Internal Medicine

## 2016-08-08 ENCOUNTER — Other Ambulatory Visit (INDEPENDENT_AMBULATORY_CARE_PROVIDER_SITE_OTHER): Payer: Medicare Other

## 2016-08-08 ENCOUNTER — Telehealth: Payer: Self-pay | Admitting: Internal Medicine

## 2016-08-08 VITALS — BP 120/70 | HR 78 | Ht 68.5 in | Wt 172.0 lb

## 2016-08-08 DIAGNOSIS — I10 Essential (primary) hypertension: Secondary | ICD-10-CM

## 2016-08-08 DIAGNOSIS — E784 Other hyperlipidemia: Secondary | ICD-10-CM

## 2016-08-08 DIAGNOSIS — I739 Peripheral vascular disease, unspecified: Secondary | ICD-10-CM | POA: Diagnosis not present

## 2016-08-08 DIAGNOSIS — E7849 Other hyperlipidemia: Secondary | ICD-10-CM

## 2016-08-08 DIAGNOSIS — J449 Chronic obstructive pulmonary disease, unspecified: Secondary | ICD-10-CM | POA: Diagnosis not present

## 2016-08-08 LAB — URINALYSIS
Bilirubin Urine: NEGATIVE
Hgb urine dipstick: NEGATIVE
Ketones, ur: NEGATIVE
LEUKOCYTES UA: NEGATIVE
NITRITE: NEGATIVE
PH: 6.5 (ref 5.0–8.0)
SPECIFIC GRAVITY, URINE: 1.015 (ref 1.000–1.030)
Total Protein, Urine: NEGATIVE
UROBILINOGEN UA: 0.2 (ref 0.0–1.0)
Urine Glucose: NEGATIVE

## 2016-08-08 LAB — BASIC METABOLIC PANEL
BUN: 16 mg/dL (ref 6–23)
CHLORIDE: 104 meq/L (ref 96–112)
CO2: 30 meq/L (ref 19–32)
Calcium: 10.1 mg/dL (ref 8.4–10.5)
Creatinine, Ser: 0.95 mg/dL (ref 0.40–1.50)
GFR: 79.45 mL/min (ref >60.00)
GLUCOSE: 130 mg/dL — AB (ref 70–99)
Potassium: 4.9 mEq/L (ref 3.5–5.1)
SODIUM: 140 meq/L (ref 135–145)

## 2016-08-08 LAB — LIPID PANEL
CHOLESTEROL: 149 mg/dL (ref 0–200)
HDL: 37.5 mg/dL — ABNORMAL LOW (ref >39.00)
LDL CALC: 79 mg/dL (ref 0–99)
NONHDL: 111.62
Total CHOL/HDL Ratio: 4
Triglycerides: 161 mg/dL — ABNORMAL HIGH (ref 0.0–149.0)
VLDL: 32.2 mg/dL (ref 0.0–40.0)

## 2016-08-08 LAB — CBC WITH DIFFERENTIAL/PLATELET
BASOS ABS: 0.1 10*3/uL (ref 0.0–0.1)
Basophils Relative: 1.1 % (ref 0.0–3.0)
EOS PCT: 3.3 % (ref 0.0–5.0)
Eosinophils Absolute: 0.3 10*3/uL (ref 0.0–0.7)
HCT: 46.8 % (ref 39.0–52.0)
Hemoglobin: 15.7 g/dL (ref 13.0–17.0)
Lymphocytes Relative: 25.7 % (ref 12.0–46.0)
Lymphs Abs: 2 10*3/uL (ref 0.7–4.0)
MCHC: 33.5 g/dL (ref 30.0–36.0)
MCV: 94.1 fl (ref 78.0–100.0)
MONO ABS: 0.7 10*3/uL (ref 0.1–1.0)
Monocytes Relative: 8.7 % (ref 3.0–12.0)
NEUTROS PCT: 61.2 % (ref 43.0–77.0)
Neutro Abs: 4.9 10*3/uL (ref 1.4–7.7)
Platelets: 236 10*3/uL (ref 150.0–400.0)
RBC: 4.98 Mil/uL (ref 4.22–5.81)
RDW: 13.6 % (ref 11.5–15.5)
WBC: 7.9 10*3/uL (ref 4.0–10.5)

## 2016-08-08 LAB — HEPATIC FUNCTION PANEL
ALT: 12 U/L (ref 0–53)
AST: 17 U/L (ref 0–37)
Albumin: 4.6 g/dL (ref 3.5–5.2)
Alkaline Phosphatase: 68 U/L (ref 39–117)
BILIRUBIN TOTAL: 1.1 mg/dL (ref 0.2–1.2)
Bilirubin, Direct: 0.2 mg/dL (ref 0.0–0.3)
Total Protein: 7.2 g/dL (ref 6.0–8.3)

## 2016-08-08 LAB — TSH: TSH: 0.81 u[IU]/mL (ref 0.35–4.50)

## 2016-08-08 NOTE — Telephone Encounter (Signed)
Notes recorded by Tanda Rockers, MD on 08/08/2016 at 1:13 PM EDT Call patient : Studies are unremarkable, no change in recs (urinalysis, labs)  Notes recorded by Tanda Rockers, MD on 08/08/2016 at 1:10 PM EDT Call pt: Reviewed cxr and no acute change so no change in recommendations made at ov - will do nipple markers at f/u --------------------  Spoke with pt, aware of results/recs.  Nothing further needed.

## 2016-08-08 NOTE — Progress Notes (Signed)
Subjective:    Patient ID: Peter Gross, male    DOB: November 12, 1926.   MRN: [id number]    Brief patient profile:  89   yowm quit smoking 1980 with HBP and chronic nonprogressive doe and PFT's 11/16/2006 FEV1 112% but ratio 57%  And  developed aodm II controlled with diet since 2008, no overt symptoms followed by Peter Gross as primary care pt    History of Present Illness  05/07/2015  f/u ov/Peter Gross re: borderline dm /hbp / djd/ gerd/ pvd/ GOLD I copd  Chief Complaint  Patient presents with  . Annual Exam    Pt is not fasting. He states doing well and denies any co's today.   Not limited by breathing from desired activities  - very sedentary, piddles in garden  rec No change rx   08/09/2015  f/u ov/Peter Gross re: borderline dm/ hbp/ djd/bph /GOLD I copd  Chief Complaint  Patient presents with  . Follow-up    Pt states doing well and no new co's today.    doe = MMRC1 = can walk nl pace, flat grade, can't hurry or go uphills or steps s sob   Main issue is intermittent HB takes prevacid otc prn helps some/ symptoms variable depending on what he eats x 6 m rec Pepcid ac 20 mg take after meals up to twice daily as needed for heartburn but if not working as well then you should restart prevacid 30 mg Take 30-60 min before first meal of the day automatically, not as needed GERD diet      08/08/2016  f/u ov/Peter Gross re:   borderline dm/ hbp/ djd/ bph copd I copd but no rx for it Chief Complaint  Patient presents with  . Annual Exam    Pt is fasting. He had had having right leg pain- bengay cream helps and now pain has resolved over the past 2 days.     active with golf/  Can do steps - no claudication/ tia/ cp and Not limited by breathing from desired activities  Muscle ache R calf resolved with massage/ ben gay and did not recur with exertion   No obvious day to day or daytime variability or assoc excess/ purulent sputum or mucus plugs or hemoptysis or cp or chest tightness, subjective wheeze or overt  sinus or hb symptoms. No unusual exp hx or h/o childhood pna/ asthma or knowledge of premature birth.  Sleeping ok without nocturnal  or early am exacerbation  of respiratory  c/o's or need for noct saba. Also denies any obvious fluctuation of symptoms with weather or environmental changes or other aggravating or alleviating factors except as outlined above   Current Medications, Allergies, Complete Past Medical History, Past Surgical History, Family History, and Social History were reviewed in [facility] record.  ROS  The following are not active complaints unless bolded sore throat, dysphagia, dental problems, itching, sneezing,  nasal congestion or excess/ purulent secretions, ear ache,   fever, chills, sweats, unintended wt loss, classically pleuritic or exertional cp,  orthopnea pnd or leg swelling, presyncope, palpitations, abdominal pain, anorexia, nausea, vomiting, diarrhea  or change in bowel or bladder habits, change in stools or urine, dysuria,hematuria,  rash, arthralgias, visual complaints, headache, numbness, weakness or ataxia or problems with walking or coordination,  change in mood/affect or memory.           Past Medical History:  Obesity  - Target wt = 171 for BMI < 26  HYPERTENSION (ICD-401.9)  -  neg cardiolyte 02/2005  DM (ICD-250.00)  - dx 09/2006, diet contolled  BENIGN PROSTATIC HYPERTROPHY/elevated psa  ...................[name]  Hx of peptic ulcer disease  DIVERTICULITIS, HX OF (ICD-V12.79..............................................Marland KitchenDeatra Gross  - colonoscopy 11/08/02  PVD (ICD-443.9)  - decreased  pedal pulses, asymptomatic  COPD  GOLD I - PFT's 11/16/06 FEV1 3.0 (112%) with ratio 57 % and no rev, nl dlco  Health Maintenance............................................................................[name]  - Td 2006,  Tdap  2013  - Pneumovax 2004, prevnar 12/26/2013  - CPX 08/08/2016     Family History:  HBP mother  Dementia / ? 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[employer], still active  Married  3 Childern  Quit smoking 1980  No ETOH  Daily Caffeine Use: 2 daily           Objective:   Physical Exam   Robust wm, alert and approp ambulatory wm nad   wt 187 October 29, 2007 > 188 May 08, 2009 > 188 May 09, 2009 > 181 October 08, 2009 > 176 11/01/2010 > 04/16/2011 183 >  12/18/2011  176 > 06/23/2012  180 > 176 12/21/2012 > 12/26/2013 180  > 06/26/14  177 > 05/07/2015 180 > 08/09/2015   173 > 02/08/2016   173 > 08/08/2016   172   Vital signs reviewed - Note on arrival 02 sats  98% on RA   HEENT:dention ok with lower dentures, upper caps nl turbinates, and orophanx.  Neck without JVD/Nodes/TM  Lungs clear to A and P bilaterally without cough on insp or exp maneuvers, mild pectus excavatum and barrel chest / slt distant bs s wheeze audible  RRR no s3 or murmur or increase in P2. no edema. Pulses in feet sym reduction both DP and PT  Abd soft and benign with nl excursion in the supine position.  Ext warm without calf tenderness, cyanosis clubbing.  Neuro: intact sensorium, no motor or cerebellar def or path reflexes MS Nl gait, no deformities or joint restrictions GU/ rectal per Dr Peter Gross  SKin:  Fair skin/ lots of actinic keratoses over exposed surfaces/ no moles      ekg 08/08/2016  wnl    Labs ordered/ reviewed:      Chemistry      Component Value Date/Time   NA 140 08/08/2016 0922   K 4.9 08/08/2016 0922   CL 104 08/08/2016 0922   CO2 30 08/08/2016 0922   BUN 16 08/08/2016 0922   CREATININE 0.95 08/08/2016 0922      Component Value Date/Time   CALCIUM 10.1 08/08/2016 0922   ALKPHOS 68 08/08/2016 0922   AST 17 08/08/2016 0922   ALT 12 08/08/2016 0922   BILITOT 1.1 08/08/2016 0922        Lab Results  Component Value Date   WBC 7.9 08/08/2016   HGB 15.7 08/08/2016   HCT 46.8 08/08/2016   MCV 94.1  08/08/2016   PLT 236.0 08/08/2016        Lab Results  Component Value Date   TSH 0.81 08/08/2016             Assessment & Plan:

## 2016-08-08 NOTE — Patient Instructions (Addendum)
Let me know if you develop pain in calves with walking that improves when you stop to rest in a repeated pattern  Keep up your follow up with Dr Jeffie Pollock and your dermatologist    Please remember to go to the lab and x-ray department downstairs in the basement  for your tests - we will call you with the results when they are available.     Please schedule a follow up visit in 6 months but call sooner if needed

## 2016-08-08 NOTE — Progress Notes (Signed)
LMTCB

## 2016-08-10 NOTE — Assessment & Plan Note (Signed)
-   PFT's 11/16/06 FEV1 3.0 (112%) with ratio 57 % and no rev, nl dlco   Minimal symptoms, no tendency to aecopd > Adequate control on present rx, reviewed in detail with pt > no change in rx needed

## 2016-08-10 NOTE — Assessment & Plan Note (Signed)
-   decrease pulses bilaterally lower ext 12/26/13   No claudication/ no tia > continue to rx risk factors with rx of bp/ continue asa 81 mg daily

## 2016-08-10 NOTE — Assessment & Plan Note (Addendum)
-   Target LDL < 70 given PVD  Lab Results  Component Value Date   CHOL 149 08/08/2016   HDL 37.50 (L) 08/08/2016   LDLCALC 79 08/08/2016   LDLDIRECT 88.2 12/26/2013   TRIG 161.0 (H) 08/08/2016   CHOLHDL 4 08/08/2016    Adequate control on present rx, reviewed in detail with pt > no change in rx needed  = diet / ex

## 2016-08-10 NOTE — Assessment & Plan Note (Addendum)
Adequate control on present rx, reviewed in detail with pt > no change in rx needed    I had an extended discussion with the patient reviewing all relevant studies completed to date and  lasting 15 to 20 minutes of a 25 minute visit    Each maintenance medication was reviewed in detail including most importantly the difference between maintenance and prns and under what circumstances the prns are to be triggered using an action plan format that is not reflected in the computer generated alphabetically organized AVS.    Please see AVS for specific instructions unique to this visit that I personally wrote and verbalized to the the pt in detail and then reviewed with pt  by my nurse highlighting any  changes in therapy recommended at today's visit to their plan of care.

## 2016-08-25 ENCOUNTER — Other Ambulatory Visit: Payer: Self-pay | Admitting: Internal Medicine

## 2017-02-09 ENCOUNTER — Other Ambulatory Visit (INDEPENDENT_AMBULATORY_CARE_PROVIDER_SITE_OTHER): Payer: Medicare Other

## 2017-02-09 ENCOUNTER — Ambulatory Visit: Payer: Medicare Other | Admitting: Internal Medicine

## 2017-02-09 ENCOUNTER — Encounter: Payer: Self-pay | Admitting: Internal Medicine

## 2017-02-09 VITALS — BP 148/78 | HR 77 | Ht 68.0 in | Wt 171.8 lb

## 2017-02-09 DIAGNOSIS — J449 Chronic obstructive pulmonary disease, unspecified: Secondary | ICD-10-CM | POA: Diagnosis not present

## 2017-02-09 DIAGNOSIS — I10 Essential (primary) hypertension: Secondary | ICD-10-CM

## 2017-02-09 DIAGNOSIS — L298 Other pruritus: Secondary | ICD-10-CM | POA: Diagnosis not present

## 2017-02-09 LAB — BASIC METABOLIC PANEL
BUN: 11 mg/dL (ref 6–23)
CHLORIDE: 102 meq/L (ref 96–112)
CO2: 27 mEq/L (ref 19–32)
Calcium: 9.9 mg/dL (ref 8.4–10.5)
Creatinine, Ser: 0.85 mg/dL (ref 0.40–1.50)
GFR: 90.23 mL/min (ref >60.00)
Glucose, Bld: 130 mg/dL — ABNORMAL HIGH (ref 70–99)
POTASSIUM: 4.5 meq/L (ref 3.5–5.1)
Sodium: 140 mEq/L (ref 135–145)

## 2017-02-09 LAB — IBC PANEL
IRON: 115 ug/dL (ref 42–165)
SATURATION RATIOS: 30.3 % (ref 20.0–50.0)
Transferrin: 271 mg/dL (ref 212.0–360.0)

## 2017-02-09 LAB — CBC WITH DIFFERENTIAL/PLATELET
BASOS ABS: 0.1 10*3/uL (ref 0.0–0.1)
Basophils Relative: 0.8 % (ref 0.0–3.0)
EOS PCT: 2.6 % (ref 0.0–5.0)
Eosinophils Absolute: 0.2 10*3/uL (ref 0.0–0.7)
HEMATOCRIT: 46.3 % (ref 39.0–52.0)
HEMOGLOBIN: 15.4 g/dL (ref 13.0–17.0)
Lymphocytes Relative: 21.3 % (ref 12.0–46.0)
Lymphs Abs: 1.9 10*3/uL (ref 0.7–4.0)
MCHC: 33.4 g/dL (ref 30.0–36.0)
MCV: 96.2 fl (ref 78.0–100.0)
MONO ABS: 0.8 10*3/uL (ref 0.1–1.0)
Monocytes Relative: 9 % (ref 3.0–12.0)
Neutro Abs: 5.8 10*3/uL (ref 1.4–7.7)
Neutrophils Relative %: 66.3 % (ref 43.0–77.0)
Platelets: 256 10*3/uL (ref 150.0–400.0)
RBC: 4.81 Mil/uL (ref 4.22–5.81)
RDW: 13.5 % (ref 11.5–15.5)
WBC: 8.8 10*3/uL (ref 4.0–10.5)

## 2017-02-09 LAB — FERRITIN: FERRITIN: 123.8 ng/mL (ref 22.0–322.0)

## 2017-02-09 NOTE — Patient Instructions (Addendum)
Please remember to go to the lab  department downstairs in the basement  for your tests - we will call you with the results when they are available.  Call your dermatologist for evaluation of new rash in meantime apply Dermarest  twice daily (Eczema 1% Hydrocortisone) and show to your dermatologist  - apply twice daily especially after bathing   Please schedule a follow up visit in 6 months but call sooner if needed for CPX

## 2017-02-09 NOTE — Progress Notes (Signed)
Subjective:    Patient ID: Peter Gross, male    DOB: 07-10-1926.   MRN: [id number]    Brief patient profile:  89   yowm quit smoking 1980 with HBP and chronic nonprogressive doe and PFT's 11/16/2006 FEV1 112% but ratio 57%  And  developed aodm II controlled with diet since 2008, no overt symptoms followed by Peter Gross as primary care pt    History of Present Illness  05/07/2015  f/u ov/Peter Gross re: borderline dm /hbp / djd/ gerd/ pvd/ GOLD I copd  Chief Complaint  Patient presents with  . Annual Exam    Pt is not fasting. He states doing well and denies any co's today.   Not limited by breathing from desired activities  - very sedentary, piddles in garden  rec No change rx   08/09/2015  f/u ov/Peter Gross re: borderline dm/ hbp/ djd/bph /GOLD I copd  Chief Complaint  Patient presents with  . Follow-up    Pt states doing well and no new co's today.    doe = MMRC1 = can walk nl pace, flat grade, can't hurry or go uphills or steps s sob   Main issue is intermittent HB takes prevacid otc prn helps some/ symptoms variable depending on what he eats x 6 m rec Pepcid ac 20 mg take after meals up to twice daily as needed for heartburn but if not working as well then you should restart prevacid 30 mg Take 30-60 min before first meal of the day automatically, not as needed GERD diet      08/08/2016  f/u ov/Peter Gross re:   borderline dm/ hbp/ djd/ bph copd I copd but no rx for it Chief Complaint  Patient presents with  . Annual Exam    Pt is fasting. He had had having right leg pain- bengay cream helps and now pain has resolved over the past 2 days.    active with golf/  Can do steps - no claudication/ tia/ cp and Not limited by breathing from desired activities  Muscle ache R calf resolved with massage/ ben gay and did not recur with exertion  rec No change rx    02/09/2017  f/u ov/Peter Gross re: skin rash / hbp,  Chief Complaint  Patient presents with  . Follow-up    Pt states his daughter was dxed  with hemochromatosis. He states having increased heartburn for the past 2-3 months. He also c/o left leg itching for the past wk.    sleeping fine tol yardwork ok  Itchy skin on legs since the cold weather, followed by Peter Gross but has not seen yet   No    excess/ purulent sputum or mucus plugs or hemoptysis or cp or chest tightness, subjective wheeze or overt sinus or hb symptoms. No unusual exposure hx or h/o childhood pna/ asthma or knowledge of premature birth.  Sleeping ok flat without nocturnal  or early am exacerbation  of respiratory  c/o's or need for noct saba. Also denies any obvious fluctuation of symptoms with weather or environmental changes or other aggravating or alleviating factors except as outlined above   Current Allergies, Complete Past Medical History, Past Surgical History, Family History, and Social History were reviewed in [employer] [facility] record.  ROS  The following are not active complaints unless bolded Hoarseness, sore throat, dysphagia, dental problems, itching, sneezing,  nasal congestion or discharge of excess mucus or purulent secretions, ear ache,   fever, chills, sweats, unintended wt loss or wt gain, classically  pleuritic or exertional cp,  orthopnea pnd or leg swelling, presyncope, palpitations, abdominal pain, anorexia, nausea, vomiting, diarrhea  or change in bowel habits or change in bladder habits, change in stools or change in urine, dysuria, hematuria,  rash, arthralgias, visual complaints, headache, numbness, weakness or ataxia or problems with walking or coordination,  change in mood/affect or memory.        Current Meds  Medication Sig  . aspirin 81 MG tablet Take 81 mg by mouth daily.  . calcium carbonate (TUMS - DOSED IN MG ELEMENTAL CALCIUM) 500 MG chewable tablet Chew 1 tablet by mouth as needed for heartburn.  . finasteride (PROSCAR) 5 MG tablet Take 5 mg by mouth daily.    . lansoprazole (PREVACID) 30 MG capsule Take 30 mg by  mouth daily as needed.  . Multiple Vitamins-Minerals (VISION VITAMINS PO) Take 2 tablets by mouth 2 (two) times daily.   . naproxen sodium (ANAPROX) 220 MG tablet Take 220 mg by mouth 3 times/day as needed-between meals & bedtime.  . Tamsulosin HCl (FLOMAX) 0.4 MG CAPS Take 0.4 mg by mouth daily.    [name] telmisartan (MICARDIS) 20 MG tablet TAKE 1/2 TABLET BY MOUTH DAILY            Past Medical History:  Obesity  - Target wt = 171 for BMI < 26  HYPERTENSION (ICD-401.9)  - neg cardiolyte 02/2005  DM (ICD-250.00)  - dx 09/2006, diet contolled  BENIGN PROSTATIC HYPERTROPHY/elevated psa  ...................[name]  Hx of peptic ulcer disease  DIVERTICULITIS, HX OF (ICD-V12.79..............................................Marland KitchenDeatra Ina  - colonoscopy 11/08/02  PVD (ICD-443.9)  - decreased  pedal pulses, asymptomatic  COPD  GOLD I - PFT's 11/16/06 FEV1 3.0 (112%) with ratio 57 % and no rev, nl dlco  Health Maintenance............................................................................[name]  - Td 2006,  Tdap  2013  - Pneumovax 2004, prevnar 12/26/2013  - CPX 08/08/2016     Family History:  HBP mother  Dementia / ? cva Father  2 siblings healthy one older sister, one younger brother No FH of Colon Cancer   Social History:  Retired--Telephone Librarian, academic, still active  Married  3 Childern  Quit smoking 1980  No ETOH  Daily Caffeine Use: 2 daily           Objective:   Physical Exam   amb elderly wm nad   wt 187 October 29, 2007 > 188 May 08, 2009 > 188 May 09, 2009 > 181 October 08, 2009 > 176 11/01/2010 > 04/16/2011 183 >  12/18/2011  176 > 06/23/2012  180 > 176 12/21/2012 > 12/26/2013 180  > 06/26/14  177 > 05/07/2015 180 > 08/09/2015   173 > 02/08/2016   173 > 08/08/2016   172 > 02/09/2017  171   Vital signs reviewed - Note on arrival 02 sats  97% on RA    HEENT:dention ok with lower dentures, upper caps nl turbinates, and orophanx.  Neck without JVD/Nodes/TM  Lungs clear  to A and P bilaterally without cough on insp or exp maneuvers, mild pectus excavatum and barrel chest / slt distant bs s wheeze audible  RRR no s3 or murmur or increase in P2. no edema. Pulses in feet sym reduction both DP and PT    HEENT: nl  turbinates bilaterally, and oropharynx. Nl external ear canals without cough reflex-  lower dentures    NECK :  without JVD/Nodes/TM/ nl carotid upstrokes bilaterally   LUNGS: no acc muscle use,   Mild pectus deformity, slt barrel  chest, distant bs bilaterally s audible wheeze    CV:  RRR  no s3 or murmur or increase in P2, and no edema   ABD:  soft and nontender with nl inspiratory excursion in the supine position. No bruits or organomegaly appreciated, bowel sounds nl  MS:  Nl gait/ ext warm without deformities, calf tenderness, cyanosis or clubbing No obvious joint restrictions   SKIN: warm and dry without lesions  - classic moderate xerotic  ezematous rash  L >> R lower portion of shins   NEURO:  alert, approp, nl sensorium with  no motor or cerebellar deficits apparent.          Labs ordered 02/09/2017   fe studies        Assessment & Plan:

## 2017-02-10 ENCOUNTER — Telehealth: Payer: Self-pay | Admitting: Internal Medicine

## 2017-02-10 NOTE — Telephone Encounter (Signed)
lmomtcb x 2 for the pt.  

## 2017-02-10 NOTE — Progress Notes (Signed)
LMTCB

## 2017-02-11 ENCOUNTER — Encounter: Payer: Self-pay | Admitting: Internal Medicine

## 2017-02-11 DIAGNOSIS — L2989 Other pruritus: Secondary | ICD-10-CM | POA: Insufficient documentation

## 2017-02-11 DIAGNOSIS — L298 Other pruritus: Secondary | ICD-10-CM | POA: Insufficient documentation

## 2017-02-11 NOTE — Assessment & Plan Note (Signed)
-   PFT's 11/16/06 FEV1 3.0 (112%) with ratio 57 % and no rev, nl dlco   No limiting sob or tendency to aecopd so no rx needed    I had an extended discussion with the patient reviewing all relevant studies completed to date and  lasting 15 to 20 minutes of a 25 minute visit    Each maintenance medication was reviewed in detail including most importantly the difference between maintenance and prns and under what circumstances the prns are to be triggered using an action plan format that is not reflected in the computer generated alphabetically organized AVS.    Please see AVS for specific instructions unique to this visit that I personally wrote and verbalized to the the pt in detail and then reviewed with pt  by my nurse highlighting any  changes in therapy recommended at today's visit to their plan of care.

## 2017-02-11 NOTE — Telephone Encounter (Signed)
Notes recorded by Tanda Rockers, MD on 02/10/2017 at 5:12 AM EST Call patient : Studies are unremarkable, no change in recs- no sign of hemachromatosis   Advised pt of results. Pt understood and nothing further is needed.

## 2017-02-11 NOTE — Assessment & Plan Note (Signed)
Reported 02/09/2017 > try HC 1% and f/u Dr Allyson Sabal

## 2017-02-11 NOTE — Assessment & Plan Note (Signed)
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

## 2017-02-11 NOTE — Assessment & Plan Note (Signed)
Adequate control on present rx, reviewed in detail with pt > no change in rx needed   

## 2017-02-18 ENCOUNTER — Other Ambulatory Visit: Payer: Self-pay | Admitting: Internal Medicine

## 2017-06-17 ENCOUNTER — Other Ambulatory Visit: Payer: Self-pay | Admitting: Internal Medicine

## 2017-08-10 ENCOUNTER — Ambulatory Visit (INDEPENDENT_AMBULATORY_CARE_PROVIDER_SITE_OTHER)
Admission: RE | Admit: 2017-08-10 | Discharge: 2017-08-10 | Disposition: A | Discharge status: Home / Self Care | Payer: Medicare Other | Source: Ambulatory Visit | Attending: Internal Medicine | Admitting: Internal Medicine

## 2017-08-10 ENCOUNTER — Other Ambulatory Visit (INDEPENDENT_AMBULATORY_CARE_PROVIDER_SITE_OTHER): Payer: Medicare Other

## 2017-08-10 ENCOUNTER — Encounter: Payer: Self-pay | Admitting: Internal Medicine

## 2017-08-10 ENCOUNTER — Ambulatory Visit: Payer: Medicare Other | Admitting: Internal Medicine

## 2017-08-10 VITALS — BP 128/70 | HR 75 | Ht 68.0 in | Wt 167.0 lb

## 2017-08-10 DIAGNOSIS — E119 Type 2 diabetes mellitus without complications: Secondary | ICD-10-CM

## 2017-08-10 DIAGNOSIS — M25561 Pain in right knee: Secondary | ICD-10-CM | POA: Diagnosis not present

## 2017-08-10 DIAGNOSIS — J449 Chronic obstructive pulmonary disease, unspecified: Secondary | ICD-10-CM

## 2017-08-10 DIAGNOSIS — E7800 Pure hypercholesterolemia, unspecified: Secondary | ICD-10-CM

## 2017-08-10 DIAGNOSIS — I10 Essential (primary) hypertension: Secondary | ICD-10-CM

## 2017-08-10 LAB — LIPID PANEL
CHOL/HDL RATIO: 4
Cholesterol: 128 mg/dL (ref 0–200)
HDL: 29.9 mg/dL — ABNORMAL LOW (ref >39.00)
LDL Cholesterol: 65 mg/dL (ref 0–99)
NONHDL: 97.99
Triglycerides: 165 mg/dL — ABNORMAL HIGH (ref 0.0–149.0)
VLDL: 33 mg/dL (ref 0.0–40.0)

## 2017-08-10 LAB — CBC WITH DIFFERENTIAL/PLATELET
Basophils Absolute: 0.1 10*3/uL (ref 0.0–0.1)
Basophils Relative: 0.9 % (ref 0.0–3.0)
EOS ABS: 0.2 10*3/uL (ref 0.0–0.7)
EOS PCT: 2.6 % (ref 0.0–5.0)
HCT: 44.6 % (ref 39.0–52.0)
Hemoglobin: 15.2 g/dL (ref 13.0–17.0)
LYMPHS ABS: 1.8 10*3/uL (ref 0.7–4.0)
Lymphocytes Relative: 22.7 % (ref 12.0–46.0)
MCHC: 34.1 g/dL (ref 30.0–36.0)
MCV: 94.4 fl (ref 78.0–100.0)
MONO ABS: 0.7 10*3/uL (ref 0.1–1.0)
Monocytes Relative: 8.7 % (ref 3.0–12.0)
NEUTROS ABS: 5.3 10*3/uL (ref 1.4–7.7)
NEUTROS PCT: 65.1 % (ref 43.0–77.0)
PLATELETS: 214 10*3/uL (ref 150.0–400.0)
RBC: 4.72 Mil/uL (ref 4.22–5.81)
RDW: 13.2 % (ref 11.5–15.5)
WBC: 8.1 10*3/uL (ref 4.0–10.5)

## 2017-08-10 LAB — BASIC METABOLIC PANEL
BUN: 13 mg/dL (ref 6–23)
CHLORIDE: 102 meq/L (ref 96–112)
CO2: 27 meq/L (ref 19–32)
CREATININE: 0.85 mg/dL (ref 0.40–1.50)
Calcium: 9.9 mg/dL (ref 8.4–10.5)
GFR: 90.13 mL/min (ref >60.00)
GLUCOSE: 129 mg/dL — AB (ref 70–99)
Potassium: 4.6 mEq/L (ref 3.5–5.1)
SODIUM: 138 meq/L (ref 135–145)

## 2017-08-10 LAB — HEPATIC FUNCTION PANEL
ALK PHOS: 65 U/L (ref 39–117)
ALT: 12 U/L (ref 0–53)
AST: 14 U/L (ref 0–37)
Albumin: 4.3 g/dL (ref 3.5–5.2)
BILIRUBIN TOTAL: 0.8 mg/dL (ref 0.2–1.2)
Bilirubin, Direct: 0.2 mg/dL (ref 0.0–0.3)
Total Protein: 7.2 g/dL (ref 6.0–8.3)

## 2017-08-10 LAB — HEMOGLOBIN A1C: HEMOGLOBIN A1C: 6.6 % — AB (ref 4.6–6.5)

## 2017-08-10 LAB — TSH: TSH: 0.8 u[IU]/mL (ref 0.35–4.50)

## 2017-08-10 NOTE — Patient Instructions (Addendum)
For knee pain  try aleve per bottle maximum with food so you can tell the knee doctor whether it helps    Please remember to go to the lab and x-ray department downstairs in the basement  for your tests - we will call you with the results when they are available.   Please see patient coordinator before you leave today  to schedule [facility] senior care for evaluation of your blood pressure  Be sure to carry your medications with you including over the counter meds to every office visit    Return here as needed for cough/ wheeze/ activity limited by breathing

## 2017-08-10 NOTE — Progress Notes (Signed)
Spoke with the pt's spouse and notified of results

## 2017-08-10 NOTE — Progress Notes (Signed)
Subjective:    Patient ID: Peter Gross, male    DOB: 04/08/1926.   MRN: [id number]    Brief patient profile:  89 yowm quit smoking 1980 with HBP and chronic nonprogressive doe and PFT's 11/16/2006 FEV1 112% but ratio 57%  And  developed aodm II controlled with diet since 2008, no overt symptoms followed by Peter Gross as primary care pt    History of Present Illness  05/07/2015  f/u ov/Peter Gross re: borderline dm /hbp / djd/ gerd/ pvd/ GOLD I copd  Chief Complaint  Patient presents with  . Annual Exam    Pt is not fasting. He states doing well and denies any co's today.   Not limited by breathing from desired activities  - very sedentary, piddles in garden  rec No change rx   08/09/2015  f/u ov/Peter Gross re: borderline dm/ hbp/ djd/bph /GOLD I copd  Chief Complaint  Patient presents with  . Follow-up    Pt states doing well and no new co's today.    doe = MMRC1 = can walk nl pace, flat grade, can't hurry or go uphills or steps s sob   Main issue is intermittent HB takes prevacid otc prn helps some/ symptoms variable depending on what he eats x 6 m rec Pepcid ac 20 mg take after meals up to twice daily as needed for heartburn but if not working as well then you should restart prevacid 30 mg Take 30-60 min before first meal of the day automatically, not as needed GERD diet     08/10/2017  f/u ov/Peter Gross re: cpx;  copd/ hbp/ borderline dm/ bph/ djd  Chief Complaint  Patient presents with  . Annual Exam    Pt is fasting. He states his appetite has not been as good lately.   Dyspnea:  Not limited, walking bothers R knee esp steps. Min resp to aleve Cough: none Sleep: fine SABA use:  None   No obvious day to day or daytime variability or assoc excess/ purulent sputum or mucus plugs or hemoptysis or cp or chest tightness, subjective wheeze or overt sinus or hb symptoms. No unusual exposure hx or h/o childhood pna/ asthma or knowledge of premature birth.  Sleeping  Fine flat   without nocturnal   or early am exacerbation  of respiratory  c/o's or need for noct saba. Also denies any obvious fluctuation of symptoms with weather or environmental changes or other aggravating or alleviating factors except as outlined above   Current Allergies, Complete Past Medical History, Past Surgical History, Family History, and Social History were reviewed in [employer] record.  ROS  The following are not active complaints unless bolded Hoarseness, sore throat, dysphagia, dental problems, itching, sneezing,  nasal congestion or discharge of excess mucus or purulent secretions, ear ache,   fever, chills, sweats, unintended wt loss, min or wt gain, classically pleuritic or exertional cp,  orthopnea pnd or arm/hand swelling  or leg swelling, presyncope, palpitations, abdominal pain, anorexia, mild, nausea, vomiting, diarrhea  or change in bowel habits or change in bladder habits, change in stools or change in urine, dysuria, hematuria,  rash, arthralgias, visual complaints, headache, numbness, weakness or ataxia or problems with walking or coordination,  change in mood or  memory.        Current Meds  Medication Sig  . aspirin 81 MG tablet Take 81 mg by mouth daily.  . calcium carbonate (TUMS - DOSED IN MG ELEMENTAL CALCIUM) 500 MG chewable tablet Chew 1  tablet by mouth as needed for heartburn.  . finasteride (PROSCAR) 5 MG tablet Take 5 mg by mouth daily.    . lansoprazole (PREVACID) 30 MG capsule Take 30 mg by mouth daily as needed.  . Multiple Vitamins-Minerals (VISION VITAMINS PO) Take 2 tablets by mouth 2 (two) times daily.   . naproxen sodium (ANAPROX) 220 MG tablet Take 220 mg by mouth 3 times/day as needed-between meals & bedtime.  . Tamsulosin HCl (FLOMAX) 0.4 MG CAPS Take 0.4 mg by mouth daily.    Peter Kitchen telmisartan (MICARDIS) 20 MG tablet TAKE 1/2 TABLET BY MOUTH DAILY                Past Medical History:  Obesity  - Target wt = 171 for BMI < 26  HYPERTENSION  (ICD-401.9)  - neg cardiolyte 02/2005  DM (ICD-250.00)  - dx 09/2006, diet contolled  BENIGN PROSTATIC HYPERTROPHY/elevated psa  ...................Peter Gross  Hx of peptic ulcer disease  DIVERTICULITIS, HX OF (ICD-V12.79..............................................Peter KitchenDeatra Gross  - colonoscopy 11/08/02  PVD (ICD-443.9)  - decreased  pedal pulses, asymptomatic  COPD  GOLD I - PFT's 11/16/06 FEV1 3.0 (112%) with ratio 57 % and no rev, nl dlco  Health Maintenance............................................................................Peter Gross  - Td 2006,  Tdap  2013  - Pneumovax 2004, prevnar 12/26/2013  - 08/10/2017     Family History:  HBP mother  Dementia / ? cva Father  2 siblings healthy one older sister, one younger brother No FH of Colon Cancer   Social History:  Retired--Telephone Librarian, academic, still active  Married  3 Childern  Quit smoking 1980  No ETOH  Daily Caffeine Use: 2 daily           Objective:   Physical Exam   amb elderly wm nad   wt 187 October 29, 2007 > 188 May 08, 2009 > 188 May 09, 2009 > 181 October 08, 2009 > 176 11/01/2010 > 04/16/2011 183 >  12/18/2011  176 > 06/23/2012  180 > 176 12/21/2012 > 12/26/2013 180  > 06/26/14  177 > 05/07/2015 180 > 08/09/2015   173 > 02/08/2016   173 > 08/08/2016   172 > 02/09/2017  171  >  08/10/2017  167   Vital signs reviewed - Note on arrival 02 sats  98% on RA     HEENT: nl   oropharynx. Nl external ear canals without cough reflex - mildderate bilateral non-specific turbinate edema and lower dentures      NECK :  without JVD/Nodes/TM/ nl carotid upstrokes bilaterally   LUNGS: no acc muscle use,  Mod barrel  contour chest wall with bilateral  Distant bs s audible wheeze and  without cough on insp or exp maneuver and mod   Hyperresonant  to  percussion bilaterally     CV:  RRR  no s3 or murmur or increase in P2, and no edema and sym reduction in both pedal pulses    ABD:  soft and nontender with pos mid insp Hoover's  in  the supine position. No bruits or organomegaly appreciated, bowel sounds nl  MS:   Nl gait/  ext warm without deformities, calf tenderness, cyanosis or clubbing R knee >L mild crepitance   SKIN: warm and dry without lesions/ mild xerotic eczema changes both lower ext    NEURO:  alert, approp, nl sensorium with  no motor or cerebellar deficits apparent.            CXR PA and Lateral:   08/10/2017 :  I personally reviewed images and agree with radiology impression as follows:  Normal for age.  No acute cardiopulmonary abnormality.       ekg 08/10/2017  SNR wnl    Labs ordered/ reviewed:      Chemistry      Component Value Date/Time   NA 138 08/10/2017 0927   K 4.6 08/10/2017 0927   CL 102 08/10/2017 0927   CO2 27 08/10/2017 0927   BUN 13 08/10/2017 0927   CREATININE 0.85 08/10/2017 0927      Component Value Date/Time   CALCIUM 9.9 08/10/2017 0927   ALKPHOS 65 08/10/2017 0927   AST 14 08/10/2017 0927   ALT 12 08/10/2017 0927   BILITOT 0.8 08/10/2017 0927     Glucose                         129               08/10/2017     Lab Results  Component Value Date   WBC 8.1 08/10/2017   HGB 15.2 08/10/2017   HCT 44.6 08/10/2017   MCV 94.4 08/10/2017   PLT 214.0 08/10/2017         Lab Results  Component Value Date   TSH 0.80 08/10/2017              Assessment & Plan:

## 2017-08-10 NOTE — Progress Notes (Signed)
Spoke with spouse and notified of results

## 2017-08-14 ENCOUNTER — Ambulatory Visit (INDEPENDENT_AMBULATORY_CARE_PROVIDER_SITE_OTHER): Payer: Medicare Other

## 2017-08-14 ENCOUNTER — Encounter (INDEPENDENT_AMBULATORY_CARE_PROVIDER_SITE_OTHER): Payer: Self-pay | Admitting: Orthopedic Surgery

## 2017-08-14 ENCOUNTER — Ambulatory Visit (INDEPENDENT_AMBULATORY_CARE_PROVIDER_SITE_OTHER): Payer: Medicare Other | Admitting: Orthopedic Surgery

## 2017-08-14 DIAGNOSIS — M25561 Pain in right knee: Secondary | ICD-10-CM | POA: Diagnosis not present

## 2017-08-14 DIAGNOSIS — G8929 Other chronic pain: Secondary | ICD-10-CM | POA: Diagnosis not present

## 2017-08-16 NOTE — Progress Notes (Signed)
Office Visit Note   Patient: Peter Gross           Date of Birth: [birth year]           MRN: [id number] Visit Date: 08/14/2017 Requested by: Tanda Rockers, MD [address] [place], [place] [address] PCP: Tanda Rockers, MD  Subjective: Chief Complaint  Patient presents with  . Right Knee - Pain    HPI: [name] is a patient with right knee pain.  Been going on for several months.  Reports pain primarily in the medial aspect of his knee with occasional pain at night.  He does get relief from Aleve.  He states that over the past several days his knee is actually improved.  His back is actually hurting him more than the knee.  He denies any mechanical symptoms in the right knee.  He has had left quad tendon repair done 4 years ago.              ROS: All systems reviewed are negative as they relate to the chief complaint within the history of present illness.  Patient denies  fevers or chills.   Assessment & Plan: Visit Diagnoses:  1. Chronic pain of right knee     Plan: Impression is right knee pain with medial meniscal cyst but his knee pain currently is very manageable and does not require intervention.  Injection will be the next step.  I will see him back as needed.  Follow-Up Instructions: Return if symptoms worsen or fail to improve.   Orders:  Orders Placed This Encounter  Procedures  . XR Knee 1-2 Views Right   No orders of the defined types were placed in this encounter.     Procedures: No procedures performed   Clinical Data: No additional findings.  Objective: Vital Signs: There were no vitals taken for this visit.  Physical Exam:   Constitutional: Patient appears well-developed HEENT:  Head: Normocephalic Eyes:EOM are normal Neck: Normal range of motion Cardiovascular: Normal rate Pulmonary/chest: Effort normal Neurologic: Patient is alert Skin: Skin is warm Psychiatric: Patient has normal mood and affect    Ortho Exam:  Orthopedic exam demonstrates a very vibrant and healthy appearing 84 year old patient.  He has a 1 cm meniscal cyst at the joint line medially.  This is confirmed under ultrasound.  There is no effusion in the right knee.  Collateral and cruciate ligaments are stable.  Extensor mechanism is intact on the right-hand side.  Pedal pulses are palpable.  No groin pain with internal or external rotation of the leg.  No other masses lymphadenopathy or skin changes noted in the right knee region.  Specialty Comments:  No specialty comments available.  Imaging: No results found.   PMFS History: Patient Active Problem List   Diagnosis Date Noted  . 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 Hematochezia 10/02/2014  . Patellar tendon rupture 10/01/2012  . Heartburn 06/23/2012  . Hyperlipidemia 12/18/2011  . DIVERTICULOSIS OF COLON 08/01/2009  . ARTHRALGIA UNSPECIFIED SITE 02/07/2009  . COPD GOLD I 10/29/2007  . Diabetes mellitus type 2, diet-controlled ([place]) 08/16/2007  . Essential hypertension 08/16/2007  . Peripheral vascular disease ([place]) 08/16/2007  . BPH with obstruction/lower urinary tract symptoms 08/16/2007   Past Medical History:  Diagnosis Date  . Arthritis    Hx: of in both knees  . Benign prostatic hypertrophy   . COPD (chronic obstructive pulmonary disease) ([place])   . Diabetes  mellitus   . Early cataracts, bilateral    Hx: of  . GERD (gastroesophageal reflux disease)    Hx:" of once in awhile"  . Hypertension   . Obesity   . PVD (peripheral vascular disease) ([facility])     Family History  Problem Relation Age of Onset  . Hypertension Mother   . Dementia Father   . Hemachromatosis Daughter     Past Surgical History:  Procedure Laterality Date  . COLONOSCOPY W/ BIOPSIES AND POLYPECTOMY     Hx: of   . QUADRICEPS TENDON REPAIR Left 10/01/2012   Procedure: REPAIR QUADRICEP TENDON- left;  Surgeon: Meredith Pel, MD;  Location: [facility];   Service: Orthopedics;  Laterality: Left;  . ROTATOR CUFF REPAIR    . TONSILLECTOMY     Social History   Occupational History  . Occupation: retired  Tobacco Use  . Smoking status: Unknown If Ever Smoked  . Smokeless tobacco: Never Used  . Tobacco comment: "never really inhaled"  Substance and Sexual Activity  . Alcohol use: No    Alcohol/week: 0.0 oz  . Drug use: No  . Sexual activity: Not on file

## 2017-08-17 ENCOUNTER — Encounter: Payer: Self-pay | Admitting: Internal Medicine

## 2017-08-17 NOTE — Assessment & Plan Note (Signed)
Not resp to nsaids > referred to ortho

## 2017-08-17 NOTE — Assessment & Plan Note (Signed)
Lab Results  Component Value Date   HGBA1C 6.6 (H) 08/10/2017   HGBA1C 6.3 02/08/2016   HGBA1C 6.3 08/09/2015    Glucose                         129               08/10/2017   rx diet/ ext

## 2017-08-17 NOTE — Assessment & Plan Note (Signed)
-   PFT's 11/16/06 FEV1 3.0 (112%) with ratio 57 % and no rev, nl dlco    Not limited by doe, no cough or aecopd tendency > no rx needed

## 2017-08-17 NOTE — Assessment & Plan Note (Signed)
-   Target LDL < 70 given PVD  Lab Results  Component Value Date   CHOL 128 08/10/2017   HDL 29.90 (L) 08/10/2017   LDLCALC 65 08/10/2017   LDLDIRECT 88.2 12/26/2013   TRIG 165.0 (H) 08/10/2017   CHOLHDL 4 08/10/2017    Adequate control on present rx, reviewed in detail with pt > no change in rx needed  = diet/ ex

## 2017-08-17 NOTE — Assessment & Plan Note (Signed)
Adequate control on present rx, reviewed in detail with pt > no change in rx needed  = micardis 20 mg one half daily   Lab Results  Component Value Date   CREATININE 0.85 08/10/2017   CREATININE 0.85 02/09/2017   CREATININE 0.95 08/08/2016

## 2017-09-17 ENCOUNTER — Other Ambulatory Visit: Payer: Self-pay | Admitting: Internal Medicine

## 2018-01-22 ENCOUNTER — Telehealth: Payer: Self-pay | Admitting: Internal Medicine

## 2018-01-22 NOTE — Telephone Encounter (Signed)
The correct phone number is [phone].

## 2018-01-22 NOTE — Telephone Encounter (Signed)
Spoke with pt, he already took care of this and nothing further is needed.

## 2018-02-09 ENCOUNTER — Telehealth: Payer: Self-pay | Admitting: Internal Medicine

## 2018-02-09 ENCOUNTER — Ambulatory Visit: Payer: Medicare Other | Admitting: Internal Medicine

## 2018-02-09 ENCOUNTER — Encounter: Payer: Self-pay | Admitting: Internal Medicine

## 2018-02-09 VITALS — BP 130/74 | HR 81 | Ht 68.0 in | Wt 162.6 lb

## 2018-02-09 DIAGNOSIS — J449 Chronic obstructive pulmonary disease, unspecified: Secondary | ICD-10-CM | POA: Diagnosis not present

## 2018-02-09 DIAGNOSIS — M25562 Pain in left knee: Secondary | ICD-10-CM

## 2018-02-09 DIAGNOSIS — L2989 Other pruritus: Secondary | ICD-10-CM

## 2018-02-09 DIAGNOSIS — L298 Other pruritus: Secondary | ICD-10-CM

## 2018-02-09 DIAGNOSIS — I10 Essential (primary) hypertension: Secondary | ICD-10-CM

## 2018-02-09 DIAGNOSIS — E119 Type 2 diabetes mellitus without complications: Secondary | ICD-10-CM

## 2018-02-09 DIAGNOSIS — M25561 Pain in right knee: Secondary | ICD-10-CM

## 2018-02-09 DIAGNOSIS — N401 Enlarged prostate with lower urinary tract symptoms: Secondary | ICD-10-CM | POA: Diagnosis not present

## 2018-02-09 DIAGNOSIS — N138 Other obstructive and reflux uropathy: Secondary | ICD-10-CM

## 2018-02-09 NOTE — Telephone Encounter (Signed)
Left message for patient stating we have gotten his message to [facility]. Nothing more needed at this time.

## 2018-02-09 NOTE — Progress Notes (Signed)
Subjective:    Patient ID: Peter Gross, male    DOB: 1926/04/26.   MRN: [id number]    Brief patient profile:  89 yowm quit smoking 1980 with HBP and chronic nonprogressive doe and PFT's 11/16/2006 FEV1 112% but ratio 57%  And  developed aodm II controlled with diet since 2008, no overt symptoms followed by Peter Gross as primary care pt    History of Present Illness  05/07/2015  f/u ov/Peter Gross re: borderline dm /hbp / djd/ gerd/ pvd/ GOLD I copd  Chief Complaint  Patient presents with  . Annual Exam    Pt is not fasting. He states doing well and denies any co's today.   Not limited by breathing from desired activities  - very sedentary, piddles in garden  rec No change rx   08/09/2015  f/u ov/Peter Gross re: borderline dm/ hbp/ djd/bph /GOLD I copd  Chief Complaint  Patient presents with  . Follow-up    Pt states doing well and no new co's today.    doe = MMRC1 = can walk nl pace, flat grade, can't hurry or go uphills or steps s sob   Main issue is intermittent HB takes prevacid otc prn helps some/ symptoms variable depending on what he eats x 6 m rec Pepcid ac 20 mg take after meals up to twice daily as needed for heartburn but if not working as well then you should restart prevacid 30 mg Take 30-60 min before first meal of the day automatically, not as needed GERD diet     08/10/2017  f/u ov/Peter Gross re: cpx;  copd/ hbp/ borderline dm/ bph/ djd  Chief Complaint  Patient presents with  . Annual Exam    Pt is fasting. He states his appetite has not been as good lately.   Dyspnea:  Not limited, walking bothers R knee esp steps. Min resp to aleve Cough: none Sleep: fine SABA use:  None rec For knee pain  try aleve per bottle maximum with food so you can tell the knee doctor whether it helps   02/09/2018  f/u ov/Peter Gross re: copd/ hbp/borderline dm /bph Chief Complaint  Patient presents with  . Follow-up   Dyspnea:  Limited by knee on R seeing ortho now  Cough: no  Sleeping: flat one pillow   SABA use: none 02: none Itching dry skin   Has derm   F/u planned  No over symptoms of dm   No obvious day to day or daytime variability or assoc excess/ purulent sputum or mucus plugs or hemoptysis or cp or chest tightness, subjective wheeze or overt sinus or hb symptoms.   Sleeping as above  without nocturnal  or early am exacerbation  of respiratory  c/o's or need for noct saba. Also denies any obvious fluctuation of symptoms with weather or environmental changes or other aggravating or alleviating factors except as outlined above   No unusual exposure hx or h/o childhood pna/ asthma or knowledge of premature birth.  Current Allergies, Complete Past Medical History, Past Surgical History, Family History, and Social History were reviewed in [employer] [facility] record.  ROS  The following are not active complaints unless bolded Hoarseness, sore throat, dysphagia, dental problems, itching skin, sneezing,  nasal congestion or discharge of excess mucus or purulent secretions, ear ache,   fever, chills, sweats, unintended wt loss or wt gain, classically pleuritic or exertional cp,  orthopnea pnd or arm/hand swelling  or leg swelling, presyncope, palpitations, abdominal pain, anorexia, nausea, vomiting, diarrhea  or change in bowel habits or change in bladder habits, change in stools or change in urine, dysuria, hematuria,  rash, arthralgias improved , visual complaints, headache, numbness, weakness or ataxia or problems with walking or coordination,  change in mood or  memory.        Current Meds  Medication Sig  . aspirin 81 MG tablet Take 81 mg by mouth daily.  . calcium carbonate (TUMS - DOSED IN MG ELEMENTAL CALCIUM) 500 MG chewable tablet Chew 1 tablet by mouth as needed for heartburn.  . finasteride (PROSCAR) 5 MG tablet Take 5 mg by mouth daily.    . lansoprazole (PREVACID) 30 MG capsule Take 30 mg by mouth daily as needed.  . Multiple Vitamins-Minerals (VISION VITAMINS  PO) Take 2 tablets by mouth 2 (two) times daily.   . naproxen sodium (ANAPROX) 220 MG tablet Take 220 mg by mouth 3 times/day as needed-between meals & bedtime.  . Tamsulosin HCl (FLOMAX) 0.4 MG CAPS Take 0.4 mg by mouth daily.    [name] telmisartan (MICARDIS) 20 MG tablet TAKE 1/2 TABLET BY MOUTH DAILY                          Past Medical History:  Obesity  - Target wt = 171 for BMI < 26  HYPERTENSION (ICD-401.9)  - neg cardiolyte 02/2005  DM (ICD-250.00)  - dx 09/2006, diet contolled  BENIGN PROSTATIC HYPERTROPHY/elevated psa  ...................[name]  Hx of peptic ulcer disease  DIVERTICULITIS, HX OF (ICD-V12.79..............................................Marland KitchenDeatra Ina  - colonoscopy 11/08/02  PVD (ICD-443.9)  - decreased  pedal pulses, asymptomatic  COPD  GOLD I - PFT's 11/16/06 FEV1 3.0 (112%) with ratio 57 % and no rev, nl dlco  Health Maintenance............................................................................[name]  - Td 2006,  Tdap  2013  - Pneumovax 2004, prevnar 12/26/2013        Family History:  HBP mother  Dementia / ? cva Father  2 siblings healthy one older sister, one younger brother No FH of Colon Cancer   Social History:  Retired--[employer], still active  Married  3 Childern  Quit smoking 1980  No ETOH  Daily Caffeine Use: 2 daily           Objective:   Physical Exam    amb pleasant wm nad  wt 187 October 29, 2007 > 188 May 08, 2009 > 188 May 09, 2009 > 181 October 08, 2009 > 176 11/01/2010 > 04/16/2011 183 >  12/18/2011  176 > 06/23/2012  180 > 176 12/21/2012 > 12/26/2013 180  > 06/26/14  177 > 05/07/2015 180 > 08/09/2015   173 > 02/08/2016   173 > 08/08/2016   172 > 02/09/2017  171  >  08/10/2017  167 > 02/09/2018  163    HEENT: nl dentition / oropharynx. Nl external ear canals without cough reflex -  Mild bilateral non-specific turbinate edema     NECK :  without JVD/Nodes/TM/ nl carotid upstrokes  bilaterally   LUNGS: no acc muscle use,  Mild barrel  contour chest wall with bilateral  Distant bs s audible wheeze and  without cough on insp or exp maneuver and mild  Hyperresonant  to  percussion bilaterally     CV:  RRR  no s3 or murmur or increase in P2, and no edema   ABD:  soft and nontender with pos end  insp Hoover's  in the supine position. No bruits or organomegaly appreciated, bowel sounds nl  MS:   Nl gait/  ext warm without deformities, calf tenderness, cyanosis or clubbing No obvious joint restrictions but knees with mild crepitance bilaterally s effusion  SKIN: warm and dry with mild xerotic eczema changes both lower ext     NEURO:  alert, approp, nl sensorium with  no motor or cerebellar deficits apparent.               Assessment & Plan:

## 2018-02-09 NOTE — Patient Instructions (Addendum)
Return to Dr Ledell Peoples clinic re dry skin - the key is to apply lotion when skin is still wet  Call me with the name of your orthopedic doctor= Blackman    Please schedule a follow up visit in 6 months but call sooner if needed

## 2018-02-10 ENCOUNTER — Encounter: Payer: Self-pay | Admitting: Internal Medicine

## 2018-02-10 NOTE — Assessment & Plan Note (Signed)
Followed by Ninfa Linden / nsaids prn have not affected bp or creat so far but advised  need to be used with caution prn only  and always taken with meals

## 2018-02-10 NOTE — Assessment & Plan Note (Signed)
-   PFT's 11/16/06 FEV1 3.0 (112%) with ratio 57 % and no rev, nl dlco    I reviewed the Fletcher curve with the patient that basically indicates  if you quit smoking when your best day FEV1 is still well preserved (as is clearly  the case here)  it is highly unlikely you will progress to severe disease and informed the patient there was  no medication on the market that has proven to alter the curve/ its downward trajectory  or the likelihood of progression of their disease(unlike other chronic medical conditions such as atheroclerosis where we do think we can change the natural hx with risk reducing meds)    Therefore stopping smoking and maintaining abstinence are  the most important aspects of care, not choice of inhalers or for that matter, doctors.   Treatment other than smoking cessation  is entirely directed by severity of symptoms and focused also on reducing exacerbations, not attempting to change the natural history of the disease> no limiting sob or tendency to aecopd so no rx needed.

## 2018-02-10 NOTE — Assessment & Plan Note (Signed)
F/u by [facility] dermatology > suggested again he apply lotions to moistened skin

## 2018-02-10 NOTE — Assessment & Plan Note (Signed)
Lab Results  Component Value Date   CREATININE 0.85 08/10/2017   CREATININE 0.85 02/09/2017   CREATININE 0.95 08/08/2016     Adequate control on present rx, reviewed in detail with pt > no change in rx needed

## 2018-02-10 NOTE — Assessment & Plan Note (Signed)
No overt symptoms to suggest dm/ check labs next ov

## 2018-02-10 NOTE — Assessment & Plan Note (Signed)
Followed by Urology/ Dr Keene Breath on flomax/ proscar

## 2018-04-27 ENCOUNTER — Encounter (INDEPENDENT_AMBULATORY_CARE_PROVIDER_SITE_OTHER): Payer: Self-pay | Admitting: Family Medicine

## 2018-04-27 ENCOUNTER — Ambulatory Visit (INDEPENDENT_AMBULATORY_CARE_PROVIDER_SITE_OTHER): Payer: Medicare Other | Admitting: Family Medicine

## 2018-04-27 VITALS — BP 115/64 | HR 76 | Temp 98.2°F | Ht 68.0 in | Wt 162.0 lb

## 2018-04-27 DIAGNOSIS — R059 Cough, unspecified: Secondary | ICD-10-CM

## 2018-04-27 DIAGNOSIS — R05 Cough: Secondary | ICD-10-CM | POA: Diagnosis not present

## 2018-04-27 MED ORDER — AZITHROMYCIN 250 MG PO TABS: ORAL_TABLET | ORAL | 0 refills | Status: DC

## 2018-04-27 NOTE — Progress Notes (Signed)
Office Visit Note   Patient: Peter Gross           Date of Birth: [birth year]           MRN: [id number] Visit Date: 04/27/2018 Requested by: Tanda Rockers, MD [address], [place] [address] PCP: Tanda Rockers, MD  Subjective: Chief Complaint  Patient presents with  . coughing , congestion    no fever , coughing, congestion , runny nose , clear     HPI: He is a 85 year old with head congestion and cough.  Symptoms started about 2 weeks ago, no definite sick contacts but his granddaughter states that he was in the hospital with his wife recently and was exposed to some illness there, and is great grandchildren have had cough and congestion as well.  Patient has lots of nasal congestion especially at night, with an occasionally productive cough.  No definite fever or chills slightly fatigued.              ROS: History of COPD, hypertension, diabetes and peripheral vascular disease all well controlled.  Objective: Vital Signs: BP 115/64 (BP Location: Left Arm)   Pulse 76   Temp 98.2 F (36.8 C) (Oral)   Ht 5\' 8"  (1.727 m)   Wt 162 lb (73.5 kg)   SpO2 95%   BMI 24.63 kg/m   Physical Exam:  HEENT:  [place]/AT, PERRLA, EOM Full, no nystagmus.  Funduscopic examination within normal limits.  No conjunctival erythema.  Tympanic membranes are pearly gray with normal landmarks.  External ear canals are normal.  Nasal passages are clear.  Oropharynx is clear.  No significant lymphadenopathy.  No thyromegaly or nodules.  2+ carotid pulses without bruits.  Nasal passages are slightly congested. CV: Regular rate and rhythm without murmurs, rubs, or gallops.  No peripheral edema.  2+ radial and posterior tibial pulses. Lungs: Clear to auscultation throughout with no wheezing or areas of consolidation.    Imaging: None today  Assessment & Plan: 1.  Head congestion and cough, possible sinusitis given the duration of symptoms. -Zithromax, follow-up as  needed.   Follow-Up Instructions: No follow-ups on file.      Procedures: No procedures performed  No notes on file    PMFS History: Patient Active Problem List   Diagnosis Date Noted  . 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 Hematochezia 10/02/2014  . Patellar tendon rupture 10/01/2012  . Heartburn 06/23/2012  . Hyperlipidemia 12/18/2011  . DIVERTICULOSIS OF COLON 08/01/2009  . Pain in knee joint 02/07/2009  . COPD GOLD I 10/29/2007  . Diabetes mellitus type 2, diet-controlled ([place]) 08/16/2007  . Essential hypertension 08/16/2007  . Peripheral vascular disease ([place]) 08/16/2007  . BPH with obstruction/lower urinary tract symptoms 08/16/2007   Past Medical History:  Diagnosis Date  . Arthritis    Hx: of in both knees  . Benign prostatic hypertrophy   . COPD (chronic obstructive pulmonary disease) ([place])   . Diabetes mellitus   . Early cataracts, bilateral    Hx: of  . GERD (gastroesophageal reflux disease)    Hx:" of once in awhile"  . Hypertension   . Obesity   . PVD (peripheral vascular disease) ([facility])     Family History  Problem Relation Age of Onset  . Hypertension Mother   . Dementia Father   . Hemachromatosis Daughter     Past Surgical History:  Procedure Laterality Date  . COLONOSCOPY  W/ BIOPSIES AND POLYPECTOMY     Hx: of   . QUADRICEPS TENDON REPAIR Left 10/01/2012   Procedure: REPAIR QUADRICEP TENDON- left;  Surgeon: Meredith Pel, MD;  Location: [place];  Service: Orthopedics;  Laterality: Left;  . ROTATOR CUFF REPAIR    . TONSILLECTOMY     Social History   Occupational History  . Occupation: retired  Tobacco Use  . Smoking status: Unknown If Ever Smoked  . Smokeless tobacco: Never Used  . Tobacco comment: "never really inhaled"  Substance and Sexual Activity  . Alcohol use: No    Alcohol/week: 0.0 standard drinks  . Drug use: No  . Sexual activity: Not on file

## 2018-08-12 ENCOUNTER — Ambulatory Visit: Payer: Medicare Other | Admitting: Internal Medicine

## 2018-09-21 ENCOUNTER — Other Ambulatory Visit: Payer: Self-pay

## 2018-09-21 ENCOUNTER — Ambulatory Visit: Payer: Medicare Other | Admitting: Internal Medicine

## 2018-09-21 ENCOUNTER — Encounter: Payer: Self-pay | Admitting: Internal Medicine

## 2018-09-21 ENCOUNTER — Telehealth: Payer: Self-pay | Admitting: Internal Medicine

## 2018-09-21 DIAGNOSIS — E119 Type 2 diabetes mellitus without complications: Secondary | ICD-10-CM

## 2018-09-21 DIAGNOSIS — I10 Essential (primary) hypertension: Secondary | ICD-10-CM

## 2018-09-21 DIAGNOSIS — R12 Heartburn: Secondary | ICD-10-CM

## 2018-09-21 DIAGNOSIS — E7800 Pure hypercholesterolemia, unspecified: Secondary | ICD-10-CM | POA: Diagnosis not present

## 2018-09-21 DIAGNOSIS — N401 Enlarged prostate with lower urinary tract symptoms: Secondary | ICD-10-CM

## 2018-09-21 DIAGNOSIS — N138 Other obstructive and reflux uropathy: Secondary | ICD-10-CM

## 2018-09-21 DIAGNOSIS — J449 Chronic obstructive pulmonary disease, unspecified: Secondary | ICD-10-CM

## 2018-09-21 LAB — LIPID PANEL
Cholesterol: 131 mg/dL (ref 0–200)
HDL: 33.7 mg/dL — ABNORMAL LOW (ref >39.00)
LDL Cholesterol: 68 mg/dL (ref 0–99)
NonHDL: 97.44
Total CHOL/HDL Ratio: 4
Triglycerides: 147 mg/dL (ref 0.0–149.0)
VLDL: 29.4 mg/dL (ref 0.0–40.0)

## 2018-09-21 LAB — BASIC METABOLIC PANEL
BUN: 12 mg/dL (ref 6–23)
CO2: 27 mEq/L (ref 19–32)
Calcium: 9.3 mg/dL (ref 8.4–10.5)
Chloride: 105 mEq/L (ref 96–112)
Creatinine, Ser: 0.78 mg/dL (ref 0.40–1.50)
GFR: 93.4 mL/min (ref >60.00)
Glucose, Bld: 103 mg/dL — ABNORMAL HIGH (ref 70–99)
Potassium: 4.5 mEq/L (ref 3.5–5.1)
Sodium: 138 mEq/L (ref 135–145)

## 2018-09-21 LAB — HEMOGLOBIN A1C: Hgb A1c MFr Bld: 6.4 % (ref 4.6–6.5)

## 2018-09-21 LAB — TSH: TSH: 0.77 u[IU]/mL (ref 0.35–4.50)

## 2018-09-21 NOTE — Progress Notes (Signed)
Subjective:   Patient ID: Peter Gross, male    DOB: 1926/09/05   MRN: [id number]    Brief patient profile:  13  yowm quit smoking 1980 with HBP and chronic nonprogressive doe and PFT's 11/16/2006 FEV1 112% but ratio 57%  And  developed aodm II controlled with diet since 2008, no overt symptoms followed by [name] as primary care pt.   History of Present Illness  05/07/2015  f/u ov/[name] re: borderline dm /hbp / djd/ gerd/ pvd/ GOLD I copd  Chief Complaint  Patient presents with  . Annual Exam    Pt is not fasting. He states doing well and denies any co's today.   Not limited by breathing from desired activities  - very sedentary, piddles in garden  rec No change rx   08/09/2015  f/u ov/[name] re: borderline dm/ hbp/ djd/bph /GOLD I copd  Chief Complaint  Patient presents with  . Follow-up    Pt states doing well and no new co's today.    doe = MMRC1 = can walk nl pace, flat grade, can't hurry or go uphills or steps s sob   Main issue is intermittent HB takes prevacid otc prn helps some/ symptoms variable depending on what he eats x 6 m rec Pepcid ac 20 mg take after meals up to twice daily as needed for heartburn but if not working as well then you should restart prevacid 30 mg Take 30-60 min before first meal of the day automatically, not as needed GERD diet     08/10/2017  f/u ov/[name] re: cpx;  copd/ hbp/ borderline dm/ bph/ djd     09/21/2018  f/u ov/[name] re: yearly f/u:  copd / hbp/ borderline dm/ djd,/ bph Chief Complaint  Patient presents with  . Follow-up    Doing well and no co's.   Dyspnea:  No change= MMRC1 = can walk nl pace, flat grade, can't hurry or go uphills or steps s sob   Cough: min am only mucoid Sleeping: flat ok one pillow SABA use: none  02: none  Some hb symptoms taking ppi p meals   No obvious day to day or daytime variability or assoc  purulent sputum or mucus plugs or hemoptysis or cp or chest tightness, subjective wheeze or overt sinus  symptoms.   sleeping without nocturnal    exacerbation  of respiratory  c/o's or need for noct saba. Also denies any obvious fluctuation of symptoms with weather or environmental changes or other aggravating or alleviating factors except as outlined above   No unusual exposure hx or h/o childhood pna/ asthma or knowledge of premature birth.  Current Allergies, Complete Past Medical History, Past Surgical History, Family History, and Social History were reviewed in [employer] [facility] record.  ROS  The following are not active complaints unless bolded Hoarseness, sore throat, dysphagia, dental problems, itching, sneezing,  nasal congestion or discharge of excess mucus or purulent secretions, ear ache,   fever, chills, sweats, unintended wt loss or wt gain, classically pleuritic or exertional cp,  orthopnea pnd or arm/hand swelling  or leg swelling, presyncope, palpitations, abdominal pain, anorexia, nausea, vomiting, diarrhea  or change in bowel habits or change in bladder habits, change in stools or change in urine, dysuria, hematuria,  rash, arthralgias, visual complaints, headache, numbness, weakness or ataxia or problems with walking or coordination,  change in mood or  memory.        Current Meds  Medication Sig  . aspirin EC  81 MG tablet Take 81 mg by mouth daily.  . calcium carbonate (TUMS - DOSED IN MG ELEMENTAL CALCIUM) 500 MG chewable tablet Chew 1 tablet by mouth as needed for heartburn.  . finasteride (PROSCAR) 5 MG tablet Take 5 mg by mouth daily.    . lansoprazole (PREVACID) 30 MG capsule Take 30 mg by mouth daily as needed.  . Multiple Vitamins-Minerals (VISION VITAMINS PO) Take 2 tablets by mouth 2 (two) times daily.   . naproxen sodium (ANAPROX) 220 MG tablet Take 220 mg by mouth 3 times/day as needed-between meals & bedtime.  . Tamsulosin HCl (FLOMAX) 0.4 MG CAPS Take 0.4 mg by mouth daily.    [name] telmisartan (MICARDIS) 20 MG tablet TAKE 1/2 TABLET BY MOUTH DAILY            Past Medical History:  Obesity  - Target wt = 171 for BMI < 26  HYPERTENSION (ICD-401.9)  - neg cardiolyte 02/2005  DM (ICD-250.00)  - dx 09/2006, diet contolled  BENIGN PROSTATIC HYPERTROPHY/elevated psa  ...................[name]  Hx of peptic ulcer disease  DIVERTICULITIS, HX OF (ICD-V12.79.............................................[name] [facility]  - colonoscopy 11/08/02  PVD (ICD-443.9)  - decreased  pedal pulses, asymptomatic  COPD  GOLD I - PFT's 11/16/06 FEV1 3.0 (112%) with ratio 57 % and no rev, nl dlco  Health Maintenance............................................................................[name]  - Td 2006,  Tdap  2013  - Pneumovax 2004, prevnar 12/26/2013        Family History:  HBP mother  Dementia / ? cva Father  2 siblings healthy one older sister, one younger brother No FH of Colon Cancer   Social History:  Engineer, petroleum, still active  Married  3 Childern  Quit smoking 1980  No ETOH  Daily Caffeine Use: 2 daily           Objective:   Physical Exam    09/21/2018    164  wt 187 October 29, 2007 > 188 May 08, 2009 > 188 May 09, 2009 > 181 October 08, 2009 > 176 11/01/2010 > 04/16/2011 183 >  12/18/2011  176 > 06/23/2012  180 > 176 12/21/2012 > 12/26/2013 180  > 06/26/14  177 > 05/07/2015 180 > 08/09/2015   173 > 02/08/2016   173 > 08/08/2016   172 > 02/09/2017  171  >  08/10/2017  167 > 02/09/2018  163  amb pleasant wm nad   Vital signs reviewed - Note on arrival 02 sats  96% on RA    . HEENT: nl dentition / oropharynx. Nl external ear canals without cough reflex -  Mild bilateral non-specific turbinate edema     NECK :  without JVD/Nodes/TM/ nl carotid upstrokes bilaterally   LUNGS: no acc muscle use,  Min barrel  contour chest wall with bilateral  slightly decreased bs s audible wheeze and  without cough on insp or exp maneuver and min  Hyperresonant  to  percussion bilaterally     CV:  RRR  no s3 or murmur or increase  in P2, and no edema   ABD:  soft and nontender with pos end  insp Hoover's  in the supine position. No bruits or organomegaly appreciated, bowel sounds nl  MS:   Nl gait/  ext warm without deformities, calf tenderness, cyanosis or clubbing No obvious joint restrictions   SKIN: warm and dry without lesions    NEURO:  alert, approp, nl sensorium with  no motor or cerebellar deficits apparent.  GU/ Rectal per Dr [name]   Labs ordered/ reviewed:      Chemistry      Component Value Date/Time   NA 138 09/21/2018 1013   K 4.5 09/21/2018 1013   CL 105 09/21/2018 1013   CO2 27 09/21/2018 1013   BUN 12 09/21/2018 1013   CREATININE 0.78 09/21/2018 1013      Component Value Date/Time   CALCIUM 9.3 09/21/2018 1013                                   Lab Results  Component Value Date   TSH 0.77 09/21/2018            Assessment & Plan:

## 2018-09-21 NOTE — Patient Instructions (Addendum)
Prevacid should be Take 30-60 min before first meal of the day   Please remember to go to the lab department   for your tests - we will call you with the results when they are available.     Please schedule a follow up visit in 6  months but call sooner if needed

## 2018-09-21 NOTE — Progress Notes (Signed)
lmtcb

## 2018-09-21 NOTE — Telephone Encounter (Signed)
Notes recorded by Tanda Rockers, MD on 09/21/2018 at 2:10 PM EDT  Call patient : Studies are unremarkable, no change in recs   Called and spoke with pt letting him know the results of the labwork. Pt verbalized understanding. Nothing further needed.

## 2018-09-22 ENCOUNTER — Encounter: Payer: Self-pay | Admitting: Internal Medicine

## 2018-09-22 NOTE — Assessment & Plan Note (Signed)
Adequate control on present rx, reviewed in detail with pt > no change in rx needed  = flomax and f/u Dr Jeffie Pollock

## 2018-09-22 NOTE — Assessment & Plan Note (Signed)
Lab Results  Component Value Date   HGBA1C 6.4 09/21/2018   HGBA1C 6.6 (H) 08/10/2017   HGBA1C 6.3 02/08/2016    Adequate control on present rx, reviewed in detail with pt > no change in rx needed  = diet

## 2018-09-22 NOTE — Assessment & Plan Note (Addendum)
Lab Results  Component Value Date   CREATININE 0.78 09/21/2018   CREATININE 0.85 08/10/2017   CREATININE 0.85 02/09/2017     Lab Results  Component Value Date   CHOL 131 09/21/2018   HDL 33.70 (L) 09/21/2018   LDLCALC 68 09/21/2018   LDLDIRECT 88.2 12/26/2013   TRIG 147.0 09/21/2018   CHOLHDL 4 09/21/2018    Adequate control on present rx, reviewed in detail with pt > no change in rx needed  = micardis  20 mg daily / no need for statin

## 2018-09-22 NOTE — Assessment & Plan Note (Signed)
-   Target LDL < 70 given PVD  Lab Results  Component Value Date   CHOL 131 09/21/2018   HDL 33.70 (L) 09/21/2018   LDLCALC 68 09/21/2018   LDLDIRECT 88.2 12/26/2013   TRIG 147.0 09/21/2018   CHOLHDL 4 09/21/2018     Adequate control on present rx, reviewed in detail with pt > no change in rx needed  = diet

## 2018-09-22 NOTE — Assessment & Plan Note (Signed)
Quit smoking 1980    - PFT's 11/16/06 FEV1 3.0 (112%) with ratio 57 % and no rev, nl dlco   No real limiting sob, no tendency to aecopd so no need for rx at this point

## 2018-09-22 NOTE — Assessment & Plan Note (Signed)
rec take ppi q am ac and if still symptomatic refer to [facility]    Each maintenance medication was reviewed in detail including most importantly the difference between maintenance and as needed and under what circumstances the prns are to be used.  Please see AVS for specific  Instructions which are unique to this visit and I personally typed out  which were reviewed in detail in writing with the patient and a copy provided.

## 2018-10-25 ENCOUNTER — Telehealth: Payer: Self-pay | Admitting: Internal Medicine

## 2018-10-25 MED ORDER — TELMISARTAN 20 MG PO TABS: 10.0000 mg | ORAL_TABLET | Freq: Every day | ORAL | 5 refills | Status: DC

## 2018-10-25 NOTE — Telephone Encounter (Signed)
Refill of pt's telmisartan has been sent to pt's preferred pharmacy. Attempted to call pt to let him know this had been done but unable to reach. Left detailed message for pt letting him know this had been done. Nothing further needed.

## 2019-03-25 ENCOUNTER — Ambulatory Visit: Payer: Medicare Other | Admitting: Internal Medicine

## 2019-03-29 ENCOUNTER — Ambulatory Visit: Payer: Medicare Other | Admitting: Internal Medicine

## 2019-03-29 ENCOUNTER — Other Ambulatory Visit: Payer: Self-pay

## 2019-03-29 ENCOUNTER — Encounter: Payer: Self-pay | Admitting: Internal Medicine

## 2019-03-29 DIAGNOSIS — R12 Heartburn: Secondary | ICD-10-CM

## 2019-03-29 DIAGNOSIS — I10 Essential (primary) hypertension: Secondary | ICD-10-CM

## 2019-03-29 DIAGNOSIS — J449 Chronic obstructive pulmonary disease, unspecified: Secondary | ICD-10-CM | POA: Diagnosis not present

## 2019-03-29 NOTE — Progress Notes (Signed)
Subjective:   Patient ID: Peter Gross, male    DOB: Oct 05, 1926   MRN: [id number]    Brief patient profile:  71 yowm quit smoking 1980 with HBP and chronic nonprogressive doe and PFT's 11/16/2006 FEV1 112% but ratio 57%  And  developed aodm II controlled with diet since 2008, no overt symptoms followed by Peter Gross as primary care pt.   History of Present Illness  05/07/2015  f/u ov/Peter Gross re: borderline dm /hbp / djd/ gerd/ pvd/ GOLD I copd  Chief Complaint  Patient presents with  . Annual Exam    Pt is not fasting. He states doing well and denies any co's today.   Not limited by breathing from desired activities  - very sedentary, piddles in garden  rec No change rx   08/09/2015  f/u ov/Peter Gross re: borderline dm/ hbp/ djd/bph /GOLD I copd  Chief Complaint  Patient presents with  . Follow-up    Pt states doing well and no new co's today.    doe = MMRC1 = can walk nl pace, flat grade, can't hurry or go uphills or steps s sob   Main issue is intermittent HB takes prevacid otc prn helps some/ symptoms variable depending on what he eats x 6 m rec Pepcid ac 20 mg take after meals up to twice daily as needed for heartburn but if not working as well then you should restart prevacid 30 mg Take 30-60 min before first meal of the day automatically, not as needed GERD diet     08/10/2017  f/u ov/Peter Gross re: cpx;  copd/ hbp/ borderline dm/ bph/ djd     09/21/2018  f/u ov/Peter Gross re: yearly f/u:  copd / hbp/ borderline dm/ djd,/ bph Chief Complaint  Patient presents with  . Follow-up    Doing well and no co's.   Dyspnea:  No change= MMRC1 = can walk nl pace, flat grade, can't hurry or go uphills or steps s sob   Cough: min am only mucoid Sleeping: flat ok one pillow SABA use: none  02: none  Some hb symptoms taking ppi p meals rec Prevacid should be Take 30-60 min before first meal of the day    03/29/2019  f/u ov/Peter Gross re:  COPD /hbp / borderline dm/ bph Chief Complaint  Patient presents  with  . Follow-up    Doing well and no new co's today.    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   Sleeping as above  without nocturnal  or early am exacerbation  of respiratory  c/o's or need for noct saba. Also denies any obvious fluctuation of symptoms with weather or environmental changes or other aggravating or alleviating factors except as outlined above   No unusual exposure hx or h/o childhood pna/ asthma or knowledge of premature birth.  Current Allergies, Complete Past Medical History, Past Surgical History, Family History, and Social History were reviewed in [facility] record.  ROS  The following are not active complaints unless bolded Hoarseness, sore throat, dysphagia, dental problems, itching, sneezing,  nasal congestion or discharge of excess mucus or purulent secretions, ear ache,  fever, chills, sweats, unintended wt loss or wt gain, classically pleuritic or exertional cp,  orthopnea pnd or arm/hand swelling  or leg swelling, presyncope, palpitations, abdominal pain, anorexia, nausea, vomiting, diarrhea  or change in bowel habits or change in bladder habits, change in stools or change in urine, dysuria, hematuria,  rash, arthralgias, visual complaints, headache, numbness, weakness or ataxia or problems with walking or coordination,  change in mood or  memory.        Current Meds  Medication Sig  . aspirin EC 81 MG tablet Take 81 mg by mouth daily.  . calcium carbonate (TUMS - DOSED IN MG ELEMENTAL CALCIUM) 500 MG chewable tablet Chew 1 tablet by mouth as needed for heartburn.  . finasteride (PROSCAR) 5 MG tablet Take 5 mg by  mouth daily.    . lansoprazole (PREVACID) 30 MG capsule Take 30 mg by mouth daily as needed.  . Multiple Vitamins-Minerals (VISION VITAMINS PO) Take 2 tablets by mouth 2 (two) times daily.   . naproxen sodium (ANAPROX) 220 MG tablet Take 220 mg by mouth 3 times/day as needed-between meals & bedtime.  . Tamsulosin HCl (FLOMAX) 0.4 MG CAPS Take 0.4 mg by mouth daily.    [name] telmisartan (MICARDIS) 20 MG tablet Take 0.5 tablets (10 mg total) by mouth daily.          Past Medical History:  Obesity  - Target wt = 171 for BMI < 26  HYPERTENSION (ICD-401.9)  - neg cardiolyte 02/2005  DM (ICD-250.00)  - dx 09/2006, diet contolled  BENIGN PROSTATIC HYPERTROPHY/elevated psa  ...................[name]  Hx of peptic ulcer disease  DIVERTICULITIS, HX OF (ICD-V12.79.............................................[name] [facility]  - colonoscopy 11/08/02  PVD (ICD-443.9)  - decreased  pedal pulses, asymptomatic  COPD  GOLD I - PFT's 11/16/06 FEV1 3.0 (112%) with ratio 57 % and no rev, nl dlco  Health Maintenance............................................................................[name]  - Td 2006,  Tdap  2013  - Pneumovax 2004, prevnar 12/26/2013        Family History:  HBP mother  Dementia / ? cva Father  2 siblings healthy one older sister, one younger brother No FH of Colon Cancer   Social History:  Engineer, petroleum, still active  Married  3 Childern  Quit smoking 1980  No ETOH  Daily Caffeine Use: 2 daily           Objective:   Physical Exam   03/29/2019   168  09/21/2018    164  wt 187 October 29, 2007 > 188 May 08, 2009 > 188 May 09, 2009 > 181 October 08, 2009 > 176 11/01/2010 > 04/16/2011 183 >  12/18/2011  176 > 06/23/2012  180 > 176 12/21/2012 > 12/26/2013 180  > 06/26/14  177 > 05/07/2015 180 > 08/09/2015   173 > 02/08/2016   173 > 08/08/2016   172 > 02/09/2017  171  >  08/10/2017  167 > 02/09/2018  163  Vital signs reviewed  03/29/2019  - Note at rest 02 sats  97% on  RA   HEENT : pt wearing mask not removed for exam due to covid - 19 concerns.   NECK :  without JVD/Nodes/TM/ nl carotid upstrokes bilaterally   LUNGS: no acc muscle use,  Min barrel  contour chest wall with bilateral  slightly decreased bs s audible wheeze and  without cough on insp or exp maneuvers and min  Hyperresonant  to  percussion bilaterally     CV:  RRR  no  s3 or murmur or increase in P2, and no edema   ABD:  soft and nontender with pos end  insp Hoover's  in the supine position. No bruits or organomegaly appreciated, bowel sounds nl  MS:   Nl gait/  ext warm without deformities, calf tenderness, cyanosis or clubbing No obvious joint restrictions   SKIN: warm and dry without lesions    NEURO:  alert, approp, nl sensorium with  no motor or cerebellar deficits apparent.                 Assessment & Plan:

## 2019-03-29 NOTE — Patient Instructions (Signed)
No change in medications  Prevacid should be Take 30-60 min before first meal of the day  GERD (REFLUX)  is an extremely common cause of respiratory symptoms just like yours , many times with no obvious heartburn at all.    It can be treated with medication, but also with lifestyle changes including elevation of the head of your bed (ideally with 6 -8inch blocks under the headboard of your bed),  Smoking cessation, avoidance of late meals, excessive alcohol, and avoid fatty foods, chocolate, peppermint, colas, red wine, and acidic juices such as orange juice.  NO MINT OR MENTHOL PRODUCTS SO NO COUGH DROPS  USE SUGARLESS CANDY INSTEAD (Jolley ranchers or Stover's or Life Savers) or even ice chips will also do - the key is to swallow to prevent all throat clearing. NO OIL BASED VITAMINS - use powdered substitutes.  Avoid fish oil when coughing.     Please schedule a follow up visit in 6  months but call sooner if needed with CPX on return .

## 2019-03-30 ENCOUNTER — Encounter: Payer: Self-pay | Admitting: Internal Medicine

## 2019-03-30 NOTE — Assessment & Plan Note (Signed)
rec PPI ac and diet/ reviewed

## 2019-03-30 NOTE — Assessment & Plan Note (Signed)
Quit smoking 1980    - PFT's 11/16/06 FEV1 3.0 (112%) with ratio 57 % and no rev, nl dlco   Gold group A - no symptoms or risk aecopd , no need for rx at this point    Pt informed of the seriousness of COVID 19 infection as a direct risk to lung health  and safey and to close contacts and should continue to wear a facemask in public and minimize exposure to public locations but especially avoid any area or activity where non-close contacts are not observing distancing or wearing an appropriate face mask.  I strongly recommended vaccine when offered.           Each maintenance medication was reviewed in detail including emphasizing most importantly the difference between maintenance and prns and under what circumstances the prns are to be triggered using an action plan format that is not reflected in the computer generated alphabetically organized AVS which I have not found useful in most complex patients, especially with respiratory illnesses  Total time for H and P, chart review, counseling, teaching device and generating AVS / charting =  20 min

## 2019-03-30 NOTE — Assessment & Plan Note (Signed)
Adequate control on present rx, reviewed in detail with pt > no change in rx needed   

## 2019-06-13 ENCOUNTER — Telehealth: Payer: Self-pay | Admitting: Internal Medicine

## 2019-06-13 NOTE — Telephone Encounter (Signed)
Spoke with Lattie Haw and she wanted to let Dr. [name] know what happened with the pt falling. She doesn't know how it really happened but he is ok and recovering well. She didn't know if he needed to follow up with Dr. [name] for this. MW please advise.

## 2019-06-13 NOTE — Telephone Encounter (Signed)
If it happened due to light headedness standing will need f/u with NP's this week

## 2019-06-13 NOTE — Telephone Encounter (Signed)
Spoke with pt's daughter, Lattie Haw. She is aware of MW's response. Nothing further needed.

## 2019-06-15 ENCOUNTER — Telehealth: Payer: Self-pay | Admitting: Internal Medicine

## 2019-06-15 NOTE — Telephone Encounter (Signed)
Spoke with patient's daughter Lattie Haw. She stated that the patient fell and passed out on Monday. He was taken to [facility] in [place] where [facility] were done. I checked the pacs system to see if I could find the images but they were not there. He is still having sharp pains while taking a deep breath. He is also having some chest pain with deep breaths. He does have a cough but tries not to cough due to the pain. Denied any increased SOB. Also denied any fevers.  He was unable to sleep last night because of the pain.   Lattie Haw was unaware of the UC told him he had any broken ribs as she wasn't the one who took him.   I have requested a copy of his OV and xray report.   In the meantime, she wants to know what to do for him.   MW, please advise. Thanks!

## 2019-06-15 NOTE — Telephone Encounter (Signed)
Spoke with Lattie Haw again. She stated that once she got off of the phone with [name], the patient told her everything that he had done yesterday while at home. She believes that he may have overdone it yesterday. He seems to be moving better today and she will keep an eye on him. I advised her if the pain comes back to call 911. She verbalized understanding. Nothing further needed at time of call.

## 2019-06-15 NOTE — Telephone Encounter (Signed)
Will need to go to er for eval if in that much pain unless we can see him here today as can't treat this over the phone

## 2019-09-26 ENCOUNTER — Ambulatory Visit: Payer: Medicare Other | Admitting: Internal Medicine

## 2019-10-25 ENCOUNTER — Ambulatory Visit: Payer: Medicare Other | Admitting: Internal Medicine

## 2019-10-25 ENCOUNTER — Encounter: Payer: Self-pay | Admitting: Internal Medicine

## 2019-10-25 ENCOUNTER — Other Ambulatory Visit: Payer: Self-pay

## 2019-10-25 DIAGNOSIS — E7800 Pure hypercholesterolemia, unspecified: Secondary | ICD-10-CM

## 2019-10-25 DIAGNOSIS — I10 Essential (primary) hypertension: Secondary | ICD-10-CM | POA: Diagnosis not present

## 2019-10-25 DIAGNOSIS — J449 Chronic obstructive pulmonary disease, unspecified: Secondary | ICD-10-CM

## 2019-10-25 NOTE — Patient Instructions (Signed)
No change in medications   Please schedule a follow up visit in 6 months  - need to be fasting

## 2019-10-25 NOTE — Progress Notes (Signed)
Subjective:   Patient ID: Peter Gross, male    DOB: 21-Dec-1926   MRN: [id number]    Brief patient profile:  89 yowm quit smoking 1980 with HBP and chronic nonprogressive doe and PFT's 11/16/2006 FEV1 112% but ratio 57%  And  developed aodm II controlled with diet since 2008, no overt symptoms followed by Peter Gross as primary care pt.   History of Present Illness  05/07/2015  f/u ov/Peter Gross re: borderline dm /hbp / djd/ gerd/ pvd/ GOLD I copd  Chief Complaint  Patient presents with  . Annual Exam    Pt is not fasting. He states doing well and denies any co's today.   Not limited by breathing from desired activities  - very sedentary, piddles in garden  rec No change rx   08/09/2015  f/u ov/Peter Gross re: borderline dm/ hbp/ djd/bph /GOLD I copd  Chief Complaint  Patient presents with  . Follow-up    Pt states doing well and no new co's today.    doe = MMRC1 = can walk nl pace, flat grade, can't hurry or go uphills or steps s sob   Main issue is intermittent HB takes prevacid otc prn helps some/ symptoms variable depending on what he eats x 6 m rec Pepcid ac 20 mg take after meals up to twice daily as needed for heartburn but if not working as well then you should restart prevacid 30 mg Take 30-60 min before first meal of the day automatically, not as needed GERD diet     08/10/2017  f/u ov/Peter Gross re: cpx;  copd/ hbp/ borderline dm/ bph/ djd     09/21/2018  f/u ov/Peter Gross re: yearly f/u:  copd / hbp/ borderline dm/ djd,/ bph Chief Complaint  Patient presents with  . Follow-up    Doing well and no co's.   Dyspnea:  No change= MMRC1 = can walk nl pace, flat grade, can't hurry or go uphills or steps s sob   Cough: min am only mucoid Sleeping: flat ok one pillow SABA use: none  02: none  Some hb symptoms taking ppi p meals rec Prevacid should be Take 30-60 min before first meal of the day    03/29/2019  f/u ov/Peter Gross re:  COPD /hbp / borderline dm/ bph Chief Complaint  Patient presents  with  . Follow-up    Doing well and no new co's today.    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   Sleeping  without nocturnal  or early am exacerbation  of respiratory  c/o's or need for noct saba. Also denies any obvious fluctuation of symptoms with weather or environmental changes or other aggravating or alleviating factors except as outlined above   No unusual exposure hx  or h/o childhood pna/ asthma or knowledge of premature birth.  Current Allergies, Complete Past Medical History, Past Surgical History, Family History, and Social History were reviewed in [employer] [facility] record.  ROS  The following are not active complaints unless bolded Hoarseness, sore throat, dysphagia, dental problems, itching, sneezing,  nasal congestion or discharge of excess mucus or purulent secretions, ear ache,   fever, chills, sweats, unintended wt loss or wt gain, classically pleuritic or exertional cp,  orthopnea pnd or arm/hand swelling  or leg swelling, presyncope, palpitations, abdominal pain, anorexia, nausea, vomiting, diarrhea  or change in bowel habits or change in bladder habits, change in stools or change in urine, dysuria, hematuria,  rash, arthralgias, visual complaints, headache, numbness, weakness or ataxia or problems with walking or  coordination,  change in mood or  memory.        Current Meds  Medication Sig  . aspirin EC 81 MG tablet Take 81 mg by mouth daily.  . calcium carbonate (TUMS - DOSED IN MG ELEMENTAL CALCIUM) 500 MG chewable tablet Chew 1 tablet by mouth as needed for heartburn.  . finasteride (PROSCAR) 5 MG tablet Take 5 mg by mouth daily.    . lansoprazole (PREVACID) 30 MG capsule Take 30 mg by mouth daily as needed.  . Multiple Vitamins-Minerals (VISION VITAMINS PO) Take 2 tablets by mouth 2 (two) times daily.   . naproxen sodium (ANAPROX) 220 MG tablet Take 220 mg by mouth 3 times/day as needed-between meals & bedtime.  . Tamsulosin HCl (FLOMAX) 0.4 MG CAPS Take 0.4 mg by mouth daily.    [name] telmisartan (MICARDIS) 20 MG tablet Take 0.5 tablets (10 mg total) by mouth daily.                Past Medical History:   HYPERTENSION (ICD-401.9)  - neg cardiolyte 02/2005  DM (ICD-250.00)  - dx 09/2006, diet contolled  BENIGN PROSTATIC HYPERTROPHY/elevated psa  ...................[name]  Hx of peptic ulcer disease  DIVERTICULITIS, HX OF (ICD-V12.79.............................................[name] [facility]  - colonoscopy 11/08/02  PVD (ICD-443.9)  - decreased  pedal pulses, asymptomatic  COPD  GOLD I - PFT's 11/16/06 FEV1 3.0 (112%) with ratio 57 % and no rev, nl dlco  Health Maintenance............................................................................[name]  - Td 2006,  Tdap  2013  - Pneumovax 2004, prevnar 12/26/2013        Family History:  HBP mother  Dementia / ? cva Father  2 siblings healthy one older sister, one younger brother No FH of Colon Cancer   Social History:  Engineer, petroleum, still active  Married  3 Childern  Quit smoking 1980  No ETOH  Daily Caffeine Use: 2 daily           Objective:   Physical Exam   10/25/2019    159   03/29/2019   168  09/21/2018    164  wt 187 October 29, 2007 > 188 May 08, 2009 > 188 May 09, 2009 > 181 October 08, 2009 > 176 11/01/2010 > 04/16/2011 183 >  12/18/2011  176 > 06/23/2012  180 > 176 12/21/2012 > 12/26/2013 180  > 06/26/14  177 > 05/07/2015 180 > 08/09/2015   173 > 02/08/2016   173 > 08/08/2016   172 > 02/09/2017  171  >  08/10/2017  167 > 02/09/2018  163   Vital signs reviewed  10/25/2019  - Note at rest 02 sats  97% on RA  amb pleasant wm nad < stated age  HEENT : pt wearing mask not removed for exam due to covid - 19 concerns.   NECK :  without JVD/Nodes/TM/ nl carotid upstrokes bilaterally   LUNGS: no acc muscle use,  Min barrel  contour chest wall with bilateral  slightly decreased bs s audible wheeze and  without cough on insp or exp maneuvers and min  Hyperresonant  to  percussion bilaterally     CV:  RRR  no s3 or murmur or increase in P2, and no edema   ABD:  soft and nontender with pos end  insp Hoover's  in the supine position. No bruits or organomegaly appreciated, bowel sounds nl  MS:   Nl gait/  ext warm without deformities, calf tenderness, cyanosis or clubbing No obvious joint restrictions   SKIN: warm and dry without lesions    NEURO:  alert, approp, nl sensorium with  no motor or cerebellar deficits apparent.            Assessment & Plan:

## 2019-10-26 ENCOUNTER — Encounter: Payer: Self-pay | Admitting: Internal Medicine

## 2019-10-26 NOTE — Assessment & Plan Note (Signed)
Target LDL < 70 given PVD  Lab Results  Component Value Date   CHOL 131 09/21/2018   HDL 33.70 (L) 09/21/2018   LDLCALC 68 09/21/2018   LDLDIRECT 88.2 12/26/2013   TRIG 147.0 09/21/2018   CHOLHDL 4 09/21/2018     F/u @ cpx         Each maintenance medication was reviewed in detail including emphasizing most importantly the difference between maintenance and prns and under what circumstances the prns are to be triggered using an action plan format where appropriate.  Total time for H and P, chart review, counseling, teaching device and generating customized AVS unique to this office visit / charting = 10 min

## 2019-10-26 NOTE — Assessment & Plan Note (Signed)
Quit smoking 1980    - PFT's 11/16/06 FEV1 3.0 (112%) with ratio 57 % and no rev, nl dlco    No flares or limiting symptoms > no rx needed

## 2019-10-26 NOTE — Assessment & Plan Note (Addendum)
Lab Results  Component Value Date   CREATININE 0.78 09/21/2018   CREATININE 0.85 08/10/2017   CREATININE 0.85 02/09/2017     Adequate control on present rx, reviewed in detail with pt > no change in rx needed   - due for CPX next ov

## 2019-12-24 ENCOUNTER — Other Ambulatory Visit: Payer: Self-pay | Admitting: Internal Medicine

## 2020-04-26 ENCOUNTER — Ambulatory Visit: Payer: Medicare Other

## 2020-04-26 ENCOUNTER — Ambulatory Visit: Payer: Medicare Other | Admitting: Internal Medicine

## 2020-04-26 ENCOUNTER — Other Ambulatory Visit: Payer: Self-pay

## 2020-04-26 ENCOUNTER — Encounter: Payer: Self-pay | Admitting: Internal Medicine

## 2020-04-26 DIAGNOSIS — E7800 Pure hypercholesterolemia, unspecified: Secondary | ICD-10-CM

## 2020-04-26 DIAGNOSIS — J449 Chronic obstructive pulmonary disease, unspecified: Secondary | ICD-10-CM

## 2020-04-26 DIAGNOSIS — E119 Type 2 diabetes mellitus without complications: Secondary | ICD-10-CM | POA: Diagnosis not present

## 2020-04-26 DIAGNOSIS — N138 Other obstructive and reflux uropathy: Secondary | ICD-10-CM

## 2020-04-26 DIAGNOSIS — R12 Heartburn: Secondary | ICD-10-CM

## 2020-04-26 DIAGNOSIS — I10 Essential (primary) hypertension: Secondary | ICD-10-CM

## 2020-04-26 DIAGNOSIS — N401 Enlarged prostate with lower urinary tract symptoms: Secondary | ICD-10-CM

## 2020-04-26 LAB — CBC WITH DIFFERENTIAL/PLATELET
Basophils Absolute: 0.1 10*3/uL (ref 0.0–0.1)
Basophils Relative: 1.1 % (ref 0.0–3.0)
Eosinophils Absolute: 0.1 10*3/uL (ref 0.0–0.7)
Eosinophils Relative: 1.9 % (ref 0.0–5.0)
HCT: 44.2 % (ref 39.0–52.0)
Hemoglobin: 14.7 g/dL (ref 13.0–17.0)
Lymphocytes Relative: 22.4 % (ref 12.0–46.0)
Lymphs Abs: 1.7 10*3/uL (ref 0.7–4.0)
MCHC: 33.3 g/dL (ref 30.0–36.0)
MCV: 94.4 fl (ref 78.0–100.0)
Monocytes Absolute: 0.8 10*3/uL (ref 0.1–1.0)
Monocytes Relative: 10.8 % (ref 3.0–12.0)
Neutro Abs: 4.8 10*3/uL (ref 1.4–7.7)
Neutrophils Relative %: 63.8 % (ref 43.0–77.0)
Platelets: 244 10*3/uL (ref 150.0–400.0)
RBC: 4.68 Mil/uL (ref 4.22–5.81)
RDW: 13.8 % (ref 11.5–15.5)
WBC: 7.5 10*3/uL (ref 4.0–10.5)

## 2020-04-26 LAB — LIPID PANEL
Cholesterol: 151 mg/dL (ref 0–200)
HDL: 44.5 mg/dL (ref >39.00)
LDL Cholesterol: 85 mg/dL (ref 0–99)
NonHDL: 106.71
Total CHOL/HDL Ratio: 3
Triglycerides: 110 mg/dL (ref 0.0–149.0)
VLDL: 22 mg/dL (ref 0.0–40.0)

## 2020-04-26 LAB — BASIC METABOLIC PANEL
BUN: 10 mg/dL (ref 6–23)
CO2: 30 mEq/L (ref 19–32)
Calcium: 9.7 mg/dL (ref 8.4–10.5)
Chloride: 102 mEq/L (ref 96–112)
Creatinine, Ser: 0.92 mg/dL (ref 0.40–1.50)
GFR: 72.42 mL/min (ref >60.00)
Glucose, Bld: 106 mg/dL — ABNORMAL HIGH (ref 70–99)
Potassium: 4.5 mEq/L (ref 3.5–5.1)
Sodium: 138 mEq/L (ref 135–145)

## 2020-04-26 LAB — TSH: TSH: 1.72 u[IU]/mL (ref 0.35–4.50)

## 2020-04-26 LAB — HEMOGLOBIN A1C: Hgb A1c MFr Bld: 6.4 % (ref 4.6–6.5)

## 2020-04-26 NOTE — Progress Notes (Signed)
Subjective:   Patient ID: Peter Gross, male    DOB: 1926/04/02   MRN: [id number]    Brief patient profile:  36   yowm quit smoking 1980 with HBP and chronic nonprogressive doe and PFT's 11/16/2006 FEV1 112% but ratio 57%  and  developed aodm II controlled with diet since 2008, no overt symptoms followed by Peter Gross as primary care pt.   History of Present Illness  05/07/2015  f/u ov/Peter Gross re: borderline dm /hbp / djd/ gerd/ pvd/ GOLD I copd  Chief Complaint  Patient presents with  . Annual Exam    Pt is not fasting. He states doing well and denies any co's today.   Not limited by breathing from desired activities  - very sedentary, piddles in garden  rec No change rx   08/09/2015  f/u ov/Peter Gross re: borderline dm/ hbp/ djd/bph /GOLD I copd  Chief Complaint  Patient presents with  . Follow-up    Pt states doing well and no new co's today.    doe = MMRC1 = can walk nl pace, flat grade, can't hurry or go uphills or steps s sob   Main issue is intermittent HB takes prevacid otc prn helps some/ symptoms variable depending on what he eats x 6 m rec Pepcid ac 20 mg take after meals up to twice daily as needed for heartburn but if not working as well then you should restart prevacid 30 mg Take 30-60 min before first meal of the day automatically, not as needed GERD diet     08/10/2017  f/u ov/Peter Gross re: cpx;  copd/ hbp/ borderline dm/ bph/ djd     09/21/2018  f/u ov/Peter Gross re: yearly f/u:  copd / hbp/ borderline dm/ djd,/ bph Chief Complaint  Patient presents with  . Follow-up    Doing well and no co's.   Dyspnea:  No change= MMRC1 = can walk nl pace, flat grade, can't hurry or go uphills or steps s sob   Cough: min am only mucoid Sleeping: flat ok one pillow SABA use: none  02: none  Some hb symptoms taking ppi p meals rec Prevacid should be Take 30-60 min before first meal of the day    03/29/2019  f/u ov/Peter Gross re:  COPD /hbp / borderline dm/ bph Chief Complaint  Patient presents  with  . Follow-up    Doing well and no new co's today.    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 Follow-up    voiced no compliants  Dyspnea:  Still doing farm work tol ok  Cough: none  Sleeping: flat/ one pillow  SABA use: none  02: none  rec No change   04/26/2020  f/u ov/Peter Gross re: annual eval   Hbp/ borderline dm /copd GOLD 1  Chief Complaint  Patient presents with  . Follow-up    Pt c/o mild heartburn in the evening before bed for the past 2 months.   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   Sleeping  without nocturnal  or early am exacerbation  of respiratory  c/o's or need for noct saba. Also denies any obvious fluctuation of symptoms with weather or environmental changes or other aggravating or alleviating factors except as outlined above   No unusual exposure hx or h/o childhood pna/ asthma or knowledge of premature birth.  Current Allergies, Complete Past Medical History, Past Surgical History, Family History, and Social History were reviewed in [employer] record.  ROS  The following are not active complaints unless bolded Hoarseness, sore throat, dysphagia, dental problems, itching, sneezing,  nasal congestion or discharge of excess mucus or purulent secretions, ear ache,    fever, chills, sweats, unintended wt loss or wt gain, classically pleuritic or exertional cp,  orthopnea pnd or arm/hand swelling  or leg swelling, presyncope, palpitations, abdominal pain, anorexia, nausea, vomiting, diarrhea  or change in bowel habits or change in bladder habits, change in stools or change in urine, dysuria, hematuria,  rash, arthralgias, visual complaints, headache, numbness, weakness or ataxia or problems with walking or coordination,  change in mood or  memory.        Current Meds  Medication Sig  . aspirin EC 81 MG tablet Take 81 mg by mouth daily.  . calcium carbonate (TUMS - DOSED IN MG ELEMENTAL CALCIUM) 500 MG chewable tablet Chew 1 tablet by mouth as needed for heartburn.  . finasteride (PROSCAR) 5 MG tablet Take 5 mg by mouth daily.  . lansoprazole (PREVACID) 30 MG capsule Take 30 mg by mouth daily as needed.  . Multiple Vitamins-Minerals (VISION VITAMINS PO) Take 2 tablets by mouth 2 (two) times daily.   . naproxen sodium (ANAPROX) 220 MG tablet Take 220 mg by mouth 3 times/day as needed-between meals & bedtime.  . Tamsulosin HCl (FLOMAX) 0.4 MG CAPS Take 0.4 mg by mouth daily.  [name] telmisartan (MICARDIS) 20 MG tablet TAKE 1/2 TABLET BY MOUTH DAILY                 Past Medical History:   HYPERTENSION (ICD-401.9)  - neg cardiolyte 02/2005  DM (ICD-250.00)  - dx 09/2006, diet contolled  BENIGN PROSTATIC HYPERTROPHY/elevated psa  ...................[name]  Hx of peptic ulcer disease  DIVERTICULITIS, HX OF (ICD-V12.79.............................................[name] [facility]  - colonoscopy 11/08/02  PVD (ICD-443.9)  - decreased  pedal pulses, asymptomatic  COPD  GOLD I - PFT's 11/16/06 FEV1 3.0 (112%) with ratio 57 % and no rev, nl dlco  Health Maintenance............................................................................[name]  - Td 2006,  Tdap  2013  - Pneumovax 2004, prevnar 12/26/2013  - CPX  04/26/2020        Family History:  HBP mother   Dementia / ? cva Father  2 siblings healthy one older sister, one younger brother No FH of Colon Cancer   Social History:  Engineer, petroleum, still active  Married  3 Childern  Quit smoking 1980  No ETOH  Daily Caffeine Use: 2 daily           Objective:   Physical Exam  04/26/2020    164 10/25/2019    159   03/29/2019   168  09/21/2018    164  wt 187 October 29, 2007 > 188 May 08, 2009 > 188 May 09, 2009 > 181 October 08, 2009 > 176 11/01/2010 > 04/16/2011 183 >  12/18/2011  176 > 06/23/2012  180 > 176 12/21/2012 > 12/26/2013 180  > 06/26/14  177 > 05/07/2015 180 > 08/09/2015   173 > 02/08/2016   173 > 08/08/2016   172 > 02/09/2017  171  >  08/10/2017  167 > 02/09/2018  163  Vital signs reviewed  04/26/2020  - Note at rest 02 sats  97% on RA   General appearance:   Amb wm nad    HEENT : pt wearing mask not removed for exam due to covid - 19 concerns.    NECK :  without JVD/Nodes/TM/ nl carotid upstrokes bilaterally   LUNGS: no acc muscle use,  Mild barrel  contour chest wall with bilateral  Distant bs s audible wheeze and  without cough on insp or exp maneuvers  and mild  Hyperresonant  to  percussion bilaterally     CV:  RRR  no s3 or murmur or increase in P2, and no edema   ABD:  soft and nontender with pos end  insp Hoover's  in the supine position. No bruits or organomegaly appreciated, bowel sounds nl  MS:   Nl gait/  ext warm without deformities, calf tenderness, cyanosis or clubbing No obvious joint restrictions   SKIN: warm and dry without lesions    NEURO:  alert, approp, nl sensorium with  no motor or cerebellar deficits apparent.   GU/ rectal per Dr Jeffie Pollock 04/25/20           Labs ordered/ reviewed:      Chemistry      Component Value Date/Time   NA 138 04/26/2020 0922   K 4.5 04/26/2020 0922   CL 102 04/26/2020 0922   CO2 30 04/26/2020 0922   BUN 10 04/26/2020 0922   CREATININE 0.92 04/26/2020 0922      Component Value Date/Time    CALCIUM 9.7 04/26/2020 0922   ALKPHOS 65 08/10/2017 0927   AST 14 08/10/2017 0927   ALT 12 08/10/2017 0927   BILITOT 0.8 08/10/2017 0927        Lab Results  Component Value Date   WBC 7.5 04/26/2020   HGB 14.7 04/26/2020   HCT 44.2 04/26/2020   MCV 94.4 04/26/2020   PLT 244.0 04/26/2020        Lab Results  Component Value Date   TSH 1.72 04/26/2020                Assessment & Plan:

## 2020-04-26 NOTE — Assessment & Plan Note (Signed)
Lab Results  Component Value Date   HGBA1C 6.4 04/26/2020   HGBA1C 6.4 09/21/2018   HGBA1C 6.6 (H) 08/10/2017    Adequate control on present rx, reviewed in detail with pt > no change in rx needed

## 2020-04-26 NOTE — Assessment & Plan Note (Signed)
Followed by Urology/ Dr Keene Breath on flomax/ proscar  Adequate control on present rx, reviewed in detail with pt > no change in rx needed

## 2020-04-26 NOTE — Assessment & Plan Note (Signed)
-   Target LDL < 70 given PVD  Lab Results  Component Value Date   CHOL 151 04/26/2020   HDL 44.50 04/26/2020   LDLCALC 85 04/26/2020   LDLDIRECT 88.2 12/26/2013   TRIG 110.0 04/26/2020   CHOLHDL 3 04/26/2020    Adequate control on present rx, reviewed in detail with pt > no change in rx needed    Medical decision making was a moderate level of complexity in this case because of  > three  chronic conditions /diagnoses requiring extra time for  H and P, chart review, counseling, and generating customized AVS unique to this office visit and charting with total time = 36 min    Each maintenance medication was reviewed in detail including emphasizing most importantly the difference between maintenance and prns and under what circumstances the prns are to be triggered using an action plan format where appropriate. Please see avs for details which were reviewed in writing by both me and my nurse and patient given a written copy highlighted where appropriate with yellow highlighter for the patient's continued care at home along with an updated version of their medications.  Patient was asked to maintain medication reconciliation by comparing this list to the actual medications being used at home and to contact this office right away if there is a conflict or discrepancy.

## 2020-04-26 NOTE — Assessment & Plan Note (Signed)
Lab Results  Component Value Date   CREATININE 0.92 04/26/2020   CREATININE 0.78 09/21/2018   CREATININE 0.85 08/10/2017    Adequate control on present rx, reviewed in detail with pt > no change in rx needed

## 2020-04-26 NOTE — Assessment & Plan Note (Signed)
Flared since dec 2021 not while on nsaids this tim  - Mostly in pma so rec deit/ add pepcid 20 mg qpm, not prn and if not better p 2 weeks call for gi eval

## 2020-04-26 NOTE — Patient Instructions (Signed)
Pepcid  (famotidine)  20 mg after supper   GERD (REFLUX)  is an extremely common cause of respiratory symptoms just like yours , many times with no obvious heartburn at all.    It can be treated with medication, but also with lifestyle changes including elevation of the head of your bed (ideally with 6 -8inch blocks under the headboard of your bed),  Smoking cessation, avoidance of late meals, excessive alcohol, and avoid fatty foods, chocolate, peppermint, colas, red wine, and acidic juices such as orange juice.  NO MINT OR MENTHOL PRODUCTS SO NO COUGH DROPS  USE SUGARLESS CANDY INSTEAD (Jolley ranchers or Stover's or Life Savers) or even ice chips will also do - the key is to swallow to prevent all throat clearing. NO OIL BASED VITAMINS - use powdered substitutes.  Avoid fish oil when coughing.    Call if not better for GI evaluation   Please schedule a follow up visit in 66months but call sooner if needed    .

## 2020-04-26 NOTE — Assessment & Plan Note (Signed)
Quit smoking 1980    - PFT's 11/16/06 FEV1 3.0 (112%) with ratio 57 % and no rev, nl dlco   Not limited by sob or having aecopd so no change rx needed

## 2020-04-27 ENCOUNTER — Encounter: Payer: Self-pay | Admitting: *Deleted

## 2020-10-24 ENCOUNTER — Other Ambulatory Visit: Payer: Self-pay

## 2020-10-24 ENCOUNTER — Encounter: Payer: Self-pay | Admitting: Internal Medicine

## 2020-10-24 ENCOUNTER — Ambulatory Visit (INDEPENDENT_AMBULATORY_CARE_PROVIDER_SITE_OTHER): Payer: Medicare Other | Admitting: Internal Medicine

## 2020-10-24 DIAGNOSIS — I10 Essential (primary) hypertension: Secondary | ICD-10-CM

## 2020-10-24 DIAGNOSIS — J449 Chronic obstructive pulmonary disease, unspecified: Secondary | ICD-10-CM

## 2020-10-24 MED ORDER — FAMOTIDINE 20 MG PO TABS: ORAL_TABLET | ORAL | 11 refills | Status: DC

## 2020-10-24 NOTE — Progress Notes (Signed)
Subjective:   Patient ID: Peter Gross, male    DOB: 1926-04-19   MRN: [id number]    Brief patient profile:  37   yowm quit smoking 1980 with HBP and chronic nonprogressive doe and PFT's 11/16/2006 FEV1 112% but ratio 57%  and  developed aodm II controlled with diet since 2008, no overt symptoms followed by Peter Gross as primary care pt.   History of Present Illness  05/07/2015  f/u ov/Peter Gross re: borderline dm /hbp / djd/ gerd/ pvd/ GOLD I copd  Chief Complaint  Patient presents with   Annual Exam    Pt is not fasting. He states doing well and denies any co's today.   Not limited by breathing from desired activities  - very sedentary, piddles in garden  rec No change rx   08/09/2015  f/u ov/Peter Gross re: borderline dm/ hbp/ djd/bph /GOLD I copd  Chief Complaint  Patient presents with   Follow-up    Pt states doing well and no new co's today.    doe = MMRC1 = can walk nl pace, flat grade, can't hurry or go uphills or steps s sob   Main issue is intermittent HB takes prevacid otc prn helps some/ symptoms variable depending on what he eats x 6 m rec Pepcid ac 20 mg take after meals up to twice daily as needed for heartburn but if not working as well then you should restart prevacid 30 mg Take 30-60 min before first meal of the day automatically, not as needed GERD diet     08/10/2017  f/u ov/Peter Gross re: cpx;  copd/ hbp/ borderline dm/ bph/ djd     09/21/2018  f/u ov/Peter Gross re: yearly f/u:  copd / hbp/ borderline dm/ djd,/ bph Chief Complaint  Patient presents with   Follow-up    Doing well and no co's.   Dyspnea:  No change= MMRC1 = can walk nl pace, flat grade, can't hurry or go uphills or steps s sob   Cough: min am only mucoid Sleeping: flat ok one pillow SABA use: none  02: none  Some hb symptoms taking ppi p meals rec Prevacid should be Take 30-60 min before first meal of the day    03/29/2019  f/u ov/Peter Gross re:  COPD /hbp / borderline dm/ bph Chief Complaint  Patient presents with    Follow-up    Doing well and no new co's today.    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, carries them feed  s cp/ sob / claudication Cough: none  Sleeping: able to lie flat / one pillow  SABA use: none  02: none  Covid status:   vax x 3  Rec Pepcid  (famotidine)  20 mg after supper  GERD diet reviewed, bed blocks rec  Call if not better for GI evaluation Please schedule a follow up visit in 12month but call sooner  if needed   10/24/2020  f/u ov/Peter Gross re: copd/ hbp / gerd  Chief Complaint  Patient presents with   Follow-up    Patient here for a follow up on BP, no concerns,    Dyspnea:  Not limited by breathing from desired activities   Cough: none to speak of Sleeping: flat bed  / one pillow no resp cc SABA use: none  02: none  Covid status:   vax x 3  Not taking ppi with freq hb p supper    No obvious day to day or daytime variability or assoc excess/ purulent sputum or mucus plugs or hemoptysis or cp or chest tightness, subjective wheeze or overt sinus  symptoms.   Sleeping  without nocturnal  or early am exacerbation  of respiratory  c/o's or need for noct saba. Also denies any obvious fluctuation of symptoms with weather or environmental changes or other aggravating or alleviating factors except as outlined above   No unusual exposure hx or h/o childhood pna/ asthma or knowledge of premature birth.  Current Allergies, Complete Past Medical History, Past Surgical History,  Family History, and Social History were reviewed in [employer] record.  ROS  The following are not active complaints unless bolded Hoarseness, sore throat, dysphagia, dental problems, itching, sneezing,  nasal congestion or discharge of excess mucus or purulent secretions, ear ache,   fever, chills, sweats, unintended wt loss or wt gain, classically pleuritic or exertional cp,  orthopnea pnd or arm/hand swelling  or leg swelling, presyncope, palpitations, abdominal pain, anorexia, nausea, vomiting, diarrhea  or change in bowel habits or change in bladder habits, change in stools or change in urine, dysuria, hematuria,  rash, arthralgias, visual complaints, headache, numbness, weakness or ataxia or problems with walking or coordination,  change in mood or  memory.        Current Meds  Medication Sig   aspirin EC 81 MG tablet Take 81 mg by mouth daily.   calcium carbonate (TUMS - DOSED IN MG ELEMENTAL CALCIUM) 500 MG chewable tablet Chew 1 tablet by mouth as needed for heartburn.   famotidine (PEPCID) 20 MG tablet One after supper   finasteride (PROSCAR) 5 MG tablet Take 5 mg by mouth daily.   lansoprazole (PREVACID) 30 MG capsule Take 30 mg by mouth daily as needed.   Multiple Vitamins-Minerals (VISION VITAMINS PO) Take 2 tablets by mouth 2 (two) times daily.    naproxen sodium (ANAPROX) 220 MG tablet Take 220 mg by mouth 3 times/day as needed-between meals & bedtime.   Tamsulosin HCl (FLOMAX) 0.4 MG CAPS Take 0.4 mg by mouth daily.   telmisartan (MICARDIS) 20 MG tablet TAKE 1/2 TABLET BY MOUTH DAILY                  Past Medical History:   HYPERTENSION (ICD-401.9)  - neg cardiolyte 02/2005  DM (ICD-250.00)  - dx 09/2006, diet contolled  BENIGN PROSTATIC HYPERTROPHY/elevated psa  ...................[name]  Hx of peptic ulcer disease  DIVERTICULITIS, HX OF (ICD-V12.79.............................................[name]  [facility]  - colonoscopy 11/08/02  PVD  (ICD-443.9)  - decreased  pedal pulses, asymptomatic  COPD  GOLD I - PFT's 11/16/06 FEV1 3.0 (112%) with ratio 57 % and no rev, nl dlco  Health Maintenance............................................................................[name] refered to [facility] care 10/24/2020  - Td 2006,  Tdap  2013  - Pneumovax 2004, prevnar 12/26/2013  - CPX  04/26/2020        Family History:  HBP mother  Dementia / ?  cva Father  2 siblings healthy one older sister, one younger brother No FH of Colon Cancer   Social History:  Engineer, petroleum, still active  Married  3 Childern  Quit smoking 1980  No ETOH  Daily Caffeine Use: 2 daily           Objective:   Physical Exam  10/24/2020    155  04/26/2020    164 10/25/2019    159   03/29/2019   168  09/21/2018    164  wt 187 October 29, 2007 > 188 May 08, 2009 > 188 May 09, 2009 > 181 October 08, 2009 > 176 11/01/2010 > 04/16/2011 183 >  12/18/2011  176 > 06/23/2012  180 > 176 12/21/2012 > 12/26/2013 180  > 06/26/14  177 > 05/07/2015 180 > 08/09/2015   173 > 02/08/2016   173 > 08/08/2016   172 > 02/09/2017  171  >  08/10/2017  167 > 02/09/2018  163  Vital signs reviewed  10/24/2020  - Note at rest 02 sats  96% on RA   General appearance:    robust amb wm nad    HEENT : pt wearing mask not removed for exam due to covid - 19 concerns.   NECK :  without JVD/Nodes/TM/ nl carotid upstrokes bilaterally   LUNGS: no acc muscle use,  Min barrel  contour chest wall with bilateral  slightly decreased bs s audible wheeze and  without cough on insp or exp maneuvers and min  Hyperresonant  to  percussion bilaterally     CV:  RRR  no s3 or murmur or increase in P2, and no edema   ABD:  soft and nontender with pos end  insp Hoover's  in the supine position. No bruits or organomegaly appreciated, bowel sounds nl  MS:   Nl gait/  ext warm without deformities, calf tenderness, cyanosis or clubbing No obvious joint restrictions   SKIN: warm and dry  without lesions    NEURO:  alert, approp, nl sensorium with  no motor or cerebellar deficits apparent.                  Assessment & Plan:

## 2020-10-24 NOTE — Patient Instructions (Addendum)
I will be referring you to [facility] / Sherrie Mustache if she has availabilty  Pepcid 20 mg after supper automatically   Can continue prevacid 30 min before your first meal

## 2020-10-24 NOTE — Assessment & Plan Note (Addendum)
Lab Results  Component Value Date   CREATININE 0.92 04/26/2020   CREATININE 0.78 09/21/2018   CREATININE 0.85 08/10/2017    Adequate control on present rx, reviewed in detail with pt > no change in rx needed    >>> referred to [facility] senior care

## 2020-10-24 NOTE — Assessment & Plan Note (Signed)
Followed as Pulmonary Patient/ [facility]/ Brant Peets  Quit smoking 1980    - PFT's 11/16/06 FEV1 3.0 (112%) with ratio 57 % and no rev, nl dlco  > 3 min discussion I reviewed the Fletcher curve with the patient that basically indicates  if you quit smoking when your best day FEV1 is still well preserved (as is clearly  the case here)  it is highly unlikely you will progress to severe disease and informed the patient there was  no medication on the market that has proven to alter the curve/ its downward trajectory  or the likelihood of progression of their disease(unlike other chronic medical conditions such as atheroclerosis where we do think we can change the natural hx with risk reducing meds)    Therefore stopping smoking and maintaining abstinence are  the most important aspects of care, not choice of inhalers or for that matter, doctors.   Treatment other than smoking cessation  is entirely directed by severity of symptoms and focused also on reducing exacerbations, not attempting to change the natural history of the disease.  As not having flares or symptoms, no need for rx  Pulmonary f/u can be prn           Each maintenance medication was reviewed in detail including emphasizing most importantly the difference between maintenance and prns and under what circumstances the prns are to be triggered using an action plan format where appropriate.  Total time for H and P, chart review, counseling,   and generating customized AVS unique to this office visit / same day charting = 25 min

## 2020-11-21 ENCOUNTER — Ambulatory Visit: Payer: Medicare Other | Admitting: Orthopaedic Surgery

## 2020-12-10 ENCOUNTER — Ambulatory Visit: Payer: Self-pay

## 2020-12-10 ENCOUNTER — Ambulatory Visit (INDEPENDENT_AMBULATORY_CARE_PROVIDER_SITE_OTHER): Payer: Medicare Other | Admitting: Orthopaedic Surgery

## 2020-12-10 ENCOUNTER — Encounter: Payer: Self-pay | Admitting: Orthopaedic Surgery

## 2020-12-10 DIAGNOSIS — M545 Low back pain, unspecified: Secondary | ICD-10-CM | POA: Diagnosis not present

## 2020-12-10 DIAGNOSIS — G8929 Other chronic pain: Secondary | ICD-10-CM

## 2020-12-10 NOTE — Progress Notes (Signed)
Office Visit Note   Patient: Peter Gross           Date of Birth: [birth year]           MRN: [id number] Visit Date: 12/10/2020              Requested by: Tanda Rockers, MD [address],  [place] [id number] PCP: Tanda Rockers, MD   Assessment & Plan: Visit Diagnoses:  1. Chronic midline low back pain without sciatica     Plan: Based on his signs and symptoms and clinical exam he seems to have facet joint arthritis in the lower lumbar spine.  I will like to send him to Dr. Ernestina Patches for bilateral L4-L5 facet joint injections to see if this will help treat his symptoms.  He agrees with this treatment plan as well.  If Dr. Ernestina Patches feels that needs to be the next level down I will leave that up to his clinical expertise.  Follow-Up Instructions: No follow-ups on file.   Orders:  Orders Placed This Encounter  Procedures   XR Lumbar Spine 2-3 Views   Ambulatory referral to Physical Medicine Rehab   No orders of the defined types were placed in this encounter.     Procedures: No procedures performed   Clinical Data: No additional findings.   Subjective: Chief Complaint  Patient presents with   Lower Back - Pain  The patient is a 87 year old active gentleman who has persistent low back pain on a daily basis worse in the morning with him.  He denies any change in bowel bladder function and denies any weakness in his legs.  There is no numbness and tingling in his legs either.  His pain is only across the lower aspect of his lumbar spine.  Does not radiate into the sciatic region on either side.  There is no radicular complaints.  He has never had back surgery either.  He is not on blood thinners.  HPI  Review of Systems There is currently listed no headache, chest pain, shortness of breath, fever, chills, nausea, vomiting  Objective: Vital Signs: There were no vitals taken for this visit.  Physical Exam He is alert and orient x3 and in no acute  distress Ortho Exam On exam he does have pretty good flexion extension of his lumbar spine but more of his pain is in extension of the lumbar spine.  He has negative straight leg raise bilaterally.  He has 5 out of 5 strength in both lower extremities and normal sensation in all dermatomes. Specialty Comments:  No specialty comments available.  Imaging: XR Lumbar Spine 2-3 Views  Result Date: 12/10/2020 2 views lumbar spine show no acute findings.  There is loss of lumbar lordosis and arthritic changes in the posterior elements of the lower lumbar spine.    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   Hemochromatosis, fm hx of (daughter)  02/09/2017   Hematochezia 10/02/2014   Patellar tendon rupture 10/01/2012   Heartburn 06/23/2012   Hyperlipidemia 12/18/2011   DIVERTICULOSIS OF COLON 08/01/2009   Pain in knee joint 02/07/2009   COPD GOLD I 10/29/2007   Diabetes mellitus type 2, diet-controlled ([place]) 08/16/2007   Essential hypertension 08/16/2007   Peripheral vascular disease ([place]) 08/16/2007   BPH with obstruction/lower urinary tract symptoms 08/16/2007   Past Medical History:  Diagnosis Date   Arthritis    Hx: of in  both knees   Benign prostatic hypertrophy    COPD (chronic obstructive pulmonary disease) (HCC)    Diabetes mellitus    Early cataracts, bilateral    Hx: of   GERD (gastroesophageal reflux disease)    Hx:" of once in awhile"   Hypertension    Obesity    PVD (peripheral vascular disease) ([place])     Family History  Problem Relation Age of Onset   Hypertension Mother    Dementia Father    Hemachromatosis Daughter     Past Surgical History:  Procedure Laterality Date   COLONOSCOPY W/ BIOPSIES AND POLYPECTOMY     Hx: of    QUADRICEPS TENDON REPAIR Left 10/01/2012   Procedure: REPAIR QUADRICEP TENDON- left;  Surgeon: Meredith Pel, MD;  Location: [facility];  Service: Orthopedics;  Laterality: Left;    ROTATOR CUFF REPAIR     TONSILLECTOMY     Social History   Occupational History   Occupation: retired  Tobacco Use   Smoking status: Unknown   Smokeless tobacco: Never   Tobacco comments:    "never really inhaled"  Substance and Sexual Activity   Alcohol use: No    Alcohol/week: 0.0 standard drinks   Drug use: No   Sexual activity: Not on file

## 2020-12-20 ENCOUNTER — Encounter: Payer: Self-pay | Admitting: Physical Medicine and Rehabilitation

## 2020-12-20 ENCOUNTER — Ambulatory Visit: Payer: Self-pay

## 2020-12-20 ENCOUNTER — Ambulatory Visit (INDEPENDENT_AMBULATORY_CARE_PROVIDER_SITE_OTHER): Payer: Medicare Other | Admitting: Physical Medicine and Rehabilitation

## 2020-12-20 ENCOUNTER — Other Ambulatory Visit: Payer: Self-pay

## 2020-12-20 VITALS — BP 119/73 | HR 89

## 2020-12-20 DIAGNOSIS — M47816 Spondylosis without myelopathy or radiculopathy, lumbar region: Secondary | ICD-10-CM | POA: Diagnosis not present

## 2020-12-20 MED ORDER — METHYLPREDNISOLONE ACETATE 80 MG/ML IJ SUSP
80.0000 mg | Freq: Once | INTRAMUSCULAR | Status: AC
  Administered 2020-12-20: 80 mg

## 2020-12-20 NOTE — Progress Notes (Signed)
Pt state lower back pain. Pt state getting out of bed in the morning is when he has the most pain. Pt state he takes over the counter pain meds and uses pain cream to help ease his pain.  Numeric Pain Rating Scale and Functional Assessment Average Pain 3   In the last MONTH (on 0-10 scale) has pain interfered with the following?  1. General activity like being  able to carry out your everyday physical activities such as walking, climbing stairs, carrying groceries, or moving a chair?  Rating(8)   +Driver, -BT, -Dye Allergies.

## 2020-12-20 NOTE — Patient Instructions (Signed)

## 2021-01-03 NOTE — Progress Notes (Signed)
SIRE POET - 87 y.o. male MRN [id number]  Date of birth: 1927-02-26  Office Visit Note: Visit Date: 12/20/2020 PCP: Tanda Rockers, MD Referred by: Tanda Rockers, MD  Subjective: Chief Complaint  Patient presents with   Lower Back - Pain   HPI:  Peter Gross is a 87 y.o. male who comes in today at the request of Dr. Jean Rosenthal for planned Bilateral  L4-5 Lumbar facet/medial branch block with fluoroscopic guidance.  The patient has failed conservative care including home exercise, medications, time and activity modification.  This injection will be diagnostic and hopefully therapeutic.  Please see requesting physician notes for further details and justification.  Exam has shown concordant pain with facet joint loading.   ROS Otherwise per HPI.  Assessment & Plan: Visit Diagnoses:    ICD-10-CM   1. Spondylosis without myelopathy or radiculopathy, lumbar region  M47.816 XR C-ARM NO REPORT    Facet Injection    methylPREDNISolone acetate (DEPO-MEDROL) injection 80 mg      Plan: No additional findings.   Meds & Orders:  Meds ordered this encounter  Medications   methylPREDNISolone acetate (DEPO-MEDROL) injection 80 mg    Orders Placed This Encounter  Procedures   Facet Injection   XR C-ARM NO REPORT    Follow-up: Return for visit to requesting physician as needed.   Procedures: No procedures performed  Lumbar Facet Joint Intra-Articular Injection(s) with Fluoroscopic Guidance  Patient: Peter Gross      Date of Birth: 25-Nov-1926 MRN: [id number] PCP: Tanda Rockers, MD      Visit Date: 12/20/2020   Universal Protocol:    Date/Time: 12/20/2020  Consent Given By: the patient  Position: PRONE   Additional Comments: Vital signs were monitored before and after the procedure. Patient was prepped and draped in the usual sterile fashion. The correct patient, procedure, and site was verified.   Injection Procedure Details:   Procedure Site One Meds Administered:  Meds ordered this encounter  Medications   methylPREDNISolone acetate (DEPO-MEDROL) injection 80 mg     Laterality: Bilateral  Location/Site:  L4-L5  Needle size: 22 guage  Needle type: Spinal  Needle Placement: Articular  Findings:  -Comments: Excellent flow of contrast producing a partial arthrogram.  Procedure Details: The fluoroscope beam is vertically oriented in AP, and the inferior recess is visualized beneath the lower pole of the inferior apophyseal process, which represents the target point for needle insertion. When direct visualization is difficult the target point is located at the medial projection of the vertebral pedicle. The region overlying each aforementioned target is locally anesthetized with a 1 to 2 ml. volume of 1% Lidocaine without Epinephrine.   The spinal needle was inserted into each of the above mentioned facet joints using biplanar fluoroscopic guidance. A 0.25 to 0.5 ml. volume of Isovue-250 was injected and a partial facet joint arthrogram was obtained. A single spot film was obtained of the resulting arthrogram.    One to 1.25 ml of the steroid/anesthetic solution was then injected into each of the facet joints noted above.   Additional Comments:  The patient tolerated the procedure well Dressing: 2 x 2 sterile gauze and Band-Aid    Post-procedure details: Patient was observed during the procedure. Post-procedure instructions were reviewed.  Patient left the clinic in stable condition.    Clinical History: 12/10/20 Lumbar Xrays 2 views lumbar spine show no acute findings.  There is loss of lumbar  lordosis and arthritic changes in  the posterior elements of the lower  lumbar spine.     Objective:  VS:  HT:    WT:   BMI:     BP:119/73  HR:89bpm  TEMP: ( )  RESP:  Physical Exam Vitals and nursing note reviewed.  Constitutional:      General: He is not in acute distress.    Appearance:  Normal appearance. He is not ill-appearing.  HENT:     Head: Normocephalic and atraumatic.     Right Ear: External ear normal.     Left Ear: External ear normal.     Nose: No congestion.  Eyes:     Extraocular Movements: Extraocular movements intact.  Cardiovascular:     Rate and Rhythm: Normal rate.     Pulses: Normal pulses.  Pulmonary:     Effort: Pulmonary effort is normal. No respiratory distress.  Abdominal:     General: There is no distension.     Palpations: Abdomen is soft.  Musculoskeletal:        General: No tenderness or signs of injury.     Cervical back: Neck supple.     Right lower leg: No edema.     Left lower leg: No edema.     Comments: Patient has good distal strength without clonus. Patient somewhat slow to rise from a seated position to full extension.  There is concordant low back pain with facet loading and lumbar spine extension rotation.  There are no definitive trigger points but the patient is somewhat tender across the lower back and PSIS.  There is no pain with hip rotation.   Skin:    Findings: No erythema or rash.  Neurological:     General: No focal deficit present.     Mental Status: He is alert and oriented to person, place, and time.     Sensory: No sensory deficit.     Motor: No weakness or abnormal muscle tone.     Coordination: Coordination normal.  Psychiatric:        Mood and Affect: Mood normal.        Behavior: Behavior normal.     Imaging: No results found.

## 2021-01-03 NOTE — Procedures (Signed)
Lumbar Facet Joint Intra-Articular Injection(s) with Fluoroscopic Guidance  Patient: Peter Gross      Date of Birth: 14-Feb-1927 MRN: [id number] PCP: Tanda Rockers, MD      Visit Date: 12/20/2020   Universal Protocol:    Date/Time: 12/20/2020  Consent Given By: the patient  Position: PRONE   Additional Comments: Vital signs were monitored before and after the procedure. Patient was prepped and draped in the usual sterile fashion. The correct patient, procedure, and site was verified.   Injection Procedure Details:  Procedure Site One Meds Administered:  Meds ordered this encounter  Medications   methylPREDNISolone acetate (DEPO-MEDROL) injection 80 mg     Laterality: Bilateral  Location/Site:  L4-L5  Needle size: 22 guage  Needle type: Spinal  Needle Placement: Articular  Findings:  -Comments: Excellent flow of contrast producing a partial arthrogram.  Procedure Details: The fluoroscope beam is vertically oriented in AP, and the inferior recess is visualized beneath the lower pole of the inferior apophyseal process, which represents the target point for needle insertion. When direct visualization is difficult the target point is located at the medial projection of the vertebral pedicle. The region overlying each aforementioned target is locally anesthetized with a 1 to 2 ml. volume of 1% Lidocaine without Epinephrine.   The spinal needle was inserted into each of the above mentioned facet joints using biplanar fluoroscopic guidance. A 0.25 to 0.5 ml. volume of Isovue-250 was injected and a partial facet joint arthrogram was obtained. A single spot film was obtained of the resulting arthrogram.    One to 1.25 ml of the steroid/anesthetic solution was then injected into each of the facet joints noted above.   Additional Comments:  The patient tolerated the procedure well Dressing: 2 x 2 sterile gauze and Band-Aid    Post-procedure details: Patient was  observed during the procedure. Post-procedure instructions were reviewed.  Patient left the clinic in stable condition.

## 2021-03-05 ENCOUNTER — Other Ambulatory Visit: Payer: Self-pay | Admitting: Internal Medicine

## 2021-04-15 ENCOUNTER — Other Ambulatory Visit: Payer: Self-pay

## 2021-04-15 ENCOUNTER — Ambulatory Visit (INDEPENDENT_AMBULATORY_CARE_PROVIDER_SITE_OTHER): Payer: Medicare Other | Admitting: Nurse Practitioner

## 2021-04-15 ENCOUNTER — Encounter: Payer: Self-pay | Admitting: Nurse Practitioner

## 2021-04-15 VITALS — BP 120/78 | HR 86 | Temp 97.3°F | Ht 68.0 in | Wt 165.8 lb

## 2021-04-15 DIAGNOSIS — E119 Type 2 diabetes mellitus without complications: Secondary | ICD-10-CM

## 2021-04-15 DIAGNOSIS — N138 Other obstructive and reflux uropathy: Secondary | ICD-10-CM

## 2021-04-15 DIAGNOSIS — M150 Primary generalized (osteo)arthritis: Secondary | ICD-10-CM

## 2021-04-15 DIAGNOSIS — I10 Essential (primary) hypertension: Secondary | ICD-10-CM

## 2021-04-15 DIAGNOSIS — I739 Peripheral vascular disease, unspecified: Secondary | ICD-10-CM

## 2021-04-15 DIAGNOSIS — K219 Gastro-esophageal reflux disease without esophagitis: Secondary | ICD-10-CM | POA: Diagnosis not present

## 2021-04-15 DIAGNOSIS — J449 Chronic obstructive pulmonary disease, unspecified: Secondary | ICD-10-CM

## 2021-04-15 DIAGNOSIS — N401 Enlarged prostate with lower urinary tract symptoms: Secondary | ICD-10-CM | POA: Diagnosis not present

## 2021-04-15 DIAGNOSIS — H9113 Presbycusis, bilateral: Secondary | ICD-10-CM

## 2021-04-15 DIAGNOSIS — M159 Polyosteoarthritis, unspecified: Secondary | ICD-10-CM

## 2021-04-15 DIAGNOSIS — H3530 Unspecified macular degeneration: Secondary | ICD-10-CM

## 2021-04-15 NOTE — Progress Notes (Signed)
Careteam: Patient Care Team: Lauree Chandler, NP as PCP - General (Geriatric Medicine) [name], MD as Attending Physician (Urology)  PLACE OF SERVICE:  [name] Directive information Does Patient Have a Medical Advance Directive?: Yes, Type of Advance Directive: [place];Living will, Does patient want to make changes to medical advance directive?: No - Patient declined  No Known Allergies  Chief Complaint  Patient presents with   Establish Care    New patient to establish care.     HPI: Patient is a 88 y.o. male to establish care.    Hypertension- taking telmisartan 1/2 tablets  GERD- he is taking famotidine 20 mg daily at supper. Reports this is effective but still has occasiaonal heart burn.   BPH- proscar and flomax due to urinary frequency. Followed by Dr Jeffie Pollock, urologist every 6 months.   Macular degeration- followed by ophthalmology, on preservision.   Osteoarthritis- in bilateral knees, does not bother him much.   HOH- has hearing aides  Has low back pain- went and got a shot and only helped for about a week.  Hurts in the morning but better once he gets moving around.    Review of Systems:  Review of Systems  Constitutional:  Negative for chills, fever and weight loss.  HENT:  Positive for hearing loss. Negative for tinnitus.   Respiratory:  Negative for cough, sputum production and shortness of breath.   Cardiovascular:  Negative for chest pain, palpitations and leg swelling.  Gastrointestinal:  Negative for abdominal pain, constipation, diarrhea and heartburn.  Genitourinary:  Negative for dysuria, frequency and urgency.  Musculoskeletal:  Negative for back pain, falls, joint pain and myalgias.  Skin: Negative.   Neurological:  Negative for dizziness and headaches.  Psychiatric/Behavioral:  Negative for depression and memory loss. The patient does not have insomnia.    Past Medical History:  Diagnosis Date    Arthritis    Hx: of in both knees   Benign prostatic hypertrophy    COPD (chronic obstructive pulmonary disease) (HCC)    Early cataracts, bilateral    Hx: of   Enlarged prostate    GERD (gastroesophageal reflux disease)    Hx:" of once in awhile"   Heart burn    Hypertension    Obesity    PVD (peripheral vascular disease) ([place])    Past Surgical History:  Procedure Laterality Date   COLONOSCOPY W/ BIOPSIES AND POLYPECTOMY     Hx: of    QUADRICEPS TENDON REPAIR Left 10/01/2012   Procedure: REPAIR QUADRICEP TENDON- left;  Surgeon: Meredith Pel, MD;  Location: [facility];  Service: Orthopedics;  Laterality: Left;   ROTATOR CUFF REPAIR     TONSILLECTOMY     Social History:   reports that he has never smoked. He has never used smokeless tobacco. He reports that he does not drink alcohol and does not use drugs.  Family History  Problem Relation Age of Onset   Hypertension Mother    Dementia Father    Hemachromatosis Daughter    Colon cancer Daughter     Medications: Patient's Medications  New Prescriptions   No medications on file  Previous Medications   ASPIRIN EC 81 MG TABLET    Take 81 mg by mouth daily.   FAMOTIDINE (PEPCID) 20 MG TABLET    One after supper   FINASTERIDE (PROSCAR) 5 MG TABLET    Take 5 mg by mouth daily.   MULTIPLE VITAMINS-MINERALS (PRESERVISION AREDS PO)  Take 1 capsule by mouth in the morning and at bedtime.   TAMSULOSIN HCL (FLOMAX) 0.4 MG CAPS    Take 0.4 mg by mouth daily.   TELMISARTAN (MICARDIS) 20 MG TABLET    TAKE 1/2 TABLET BY MOUTH DAILY  Modified Medications   No medications on file  Discontinued Medications   No medications on file    Physical Exam:  Vitals:   04/15/21 1326  BP: 140/70  Pulse: 86  Temp: (!) 97.3 F (36.3 C)  SpO2: 97%  Weight: 165 lb 12.8 oz (75.2 kg)  Height: '5\' 8"'  (1.727 m)   Body mass index is 25.21 kg/m. Wt Readings from Last 3 Encounters:  04/15/21 165 lb 12.8 oz (75.2 kg)  10/24/20 155 lb 12.8 oz  (70.7 kg)  04/26/20 164 lb (74.4 kg)    Physical Exam Constitutional:      General: He is not in acute distress.    Appearance: He is well-developed. He is not diaphoretic.  HENT:     Head: Normocephalic and atraumatic.     Right Ear: External ear normal.     Left Ear: External ear normal.     Mouth/Throat:     Pharynx: No oropharyngeal exudate.  Eyes:     Conjunctiva/sclera: Conjunctivae normal.     Pupils: Pupils are equal, round, and reactive to light.  Cardiovascular:     Rate and Rhythm: Normal rate and regular rhythm.     Heart sounds: Normal heart sounds.  Pulmonary:     Effort: Pulmonary effort is normal.     Breath sounds: Normal breath sounds.  Abdominal:     General: Bowel sounds are normal.     Palpations: Abdomen is soft.  Musculoskeletal:        General: No tenderness.     Cervical back: Normal range of motion and neck supple.     Right lower leg: No edema.     Left lower leg: No edema.  Skin:    General: Skin is warm and dry.  Neurological:     Mental Status: He is alert and oriented to person, place, and time.  Psychiatric:        Mood and Affect: Mood normal.        Behavior: Behavior normal.    Labs reviewed: Basic Metabolic Panel: Recent Labs    04/26/20 0922  NA 138  K 4.5  CL 102  CO2 30  GLUCOSE 106*  BUN 10  CREATININE 0.92  CALCIUM 9.7  TSH 1.72   Liver Function Tests: No results for input(s): AST, ALT, ALKPHOS, BILITOT, PROT, ALBUMIN in the last 8760 hours. No results for input(s): LIPASE, AMYLASE in the last 8760 hours. No results for input(s): AMMONIA in the last 8760 hours. CBC: Recent Labs    04/26/20 0922  WBC 7.5  NEUTROABS 4.8  HGB 14.7  HCT 44.2  MCV 94.4  PLT 244.0   Lipid Panel: Recent Labs    04/26/20 0922  CHOL 151  HDL 44.50  LDLCALC 85  TRIG 110.0  CHOLHDL 3   TSH: Recent Labs    04/26/20 0922  TSH 1.72   A1C: Lab Results  Component Value Date   HGBA1C 6.4 04/26/2020      Assessment/Plan 1. Essential hypertension -improved on recheck.  Blood pressure well controlled Continue current medications Recheck metabolic panel - CMP with eGFR([facility]) - CBC with Differential/Platelet  2. Gastroesophageal reflux disease without esophagitis -encouraged dietary compliance and lifestyle modifications in addition to Pepcid  daily to help control symptoms.   3. Diabetes mellitus type 2, diet-controlled ([place]) -Encouraged dietary compliance, routine foot care/monitoring and to keep up with diabetic eye exams through ophthalmology  - Hemoglobin A1c  4. BPH with obstruction/lower urinary tract symptoms -continues to follow up with urology, continues on proscar and flomax  5. Presbycusis of both ears -wears bilateral hearing aides   6. Primary osteoarthritis involving multiple joints -can use tylenol 325 mg 1-2 tablets every 6 hours as needed pain.   7. Macular degeneration, unspecified laterality, unspecified type Continues to follow up with ophthalmology.   8. COPD mixed type [facility]) Not requiring any medication. No shortness of breath, cough or congestion.   9. Peripheral vascular disease ([place]) Without symptoms at this time, continues on asa daily    Return in about 6 weeks (around 05/27/2021) for in office AWV. Peter Gross. [place], [facility] [phone]

## 2021-04-16 LAB — COMPLETE METABOLIC PANEL WITH GFR
AG Ratio: 1.5 (calc) (ref 1.0–2.5)
ALT: 12 U/L (ref 9–46)
AST: 17 U/L (ref 10–35)
Albumin: 4.1 g/dL (ref 3.6–5.1)
Alkaline phosphatase (APISO): 73 U/L (ref 35–144)
BUN: 13 mg/dL (ref 7–25)
CO2: 31 mmol/L (ref 20–32)
Calcium: 9.9 mg/dL (ref 8.6–10.3)
Chloride: 101 mmol/L (ref 98–110)
Creat: 1.05 mg/dL (ref 0.70–1.22)
Globulin: 2.8 g/dL (calc) (ref 1.9–3.7)
Glucose, Bld: 134 mg/dL (ref 65–139)
Potassium: 4.9 mmol/L (ref 3.5–5.3)
Sodium: 140 mmol/L (ref 135–146)
Total Bilirubin: 0.9 mg/dL (ref 0.2–1.2)
Total Protein: 6.9 g/dL (ref 6.1–8.1)
eGFR: 66 mL/min/{1.73_m2} (ref >=60)

## 2021-04-16 LAB — CBC WITH DIFFERENTIAL/PLATELET
Absolute Monocytes: 845 cells/uL (ref 200–950)
Basophils Absolute: 66 cells/uL (ref 0–200)
Basophils Relative: 0.8 %
Eosinophils Absolute: 172 cells/uL (ref 15–500)
Eosinophils Relative: 2.1 %
HCT: 44 % (ref 38.5–50.0)
Hemoglobin: 14.8 g/dL (ref 13.2–17.1)
Lymphs Abs: 2001 cells/uL (ref 850–3900)
MCH: 31.3 pg (ref 27.0–33.0)
MCHC: 33.6 g/dL (ref 32.0–36.0)
MCV: 93 fL (ref 80.0–100.0)
MPV: 10.2 fL (ref 7.5–12.5)
Monocytes Relative: 10.3 %
Neutro Abs: 5117 cells/uL (ref 1500–7800)
Neutrophils Relative %: 62.4 %
Platelets: 264 10*3/uL (ref 140–400)
RBC: 4.73 10*6/uL (ref 4.20–5.80)
RDW: 12.5 % (ref 11.0–15.0)
Total Lymphocyte: 24.4 %
WBC: 8.2 10*3/uL (ref 3.8–10.8)

## 2021-04-16 LAB — HEMOGLOBIN A1C
Hgb A1c MFr Bld: 6.4 % of total Hgb — ABNORMAL HIGH (ref <5.7)
Mean Plasma Glucose: 137 mg/dL
eAG (mmol/L): 7.6 mmol/L

## 2021-05-02 ENCOUNTER — Encounter: Payer: Self-pay | Admitting: Nurse Practitioner

## 2021-06-24 ENCOUNTER — Encounter: Payer: Self-pay | Admitting: Nurse Practitioner

## 2021-06-24 ENCOUNTER — Ambulatory Visit (INDEPENDENT_AMBULATORY_CARE_PROVIDER_SITE_OTHER): Payer: Medicare Other | Admitting: Nurse Practitioner

## 2021-06-24 VITALS — BP 138/90 | HR 84 | Temp 97.5°F | Ht 68.5 in | Wt 163.0 lb

## 2021-06-24 DIAGNOSIS — Z0000 Encounter for general adult medical examination without abnormal findings: Secondary | ICD-10-CM

## 2021-06-24 NOTE — Patient Instructions (Signed)
Peter Gross , ?Thank you for taking time to come for your Medicare Wellness Visit. I appreciate your ongoing commitment to your health goals. Please review the following plan we discussed and let me know if I can assist you in the future.  ? ?Screening recommendations/referrals: ?Colonoscopy aged out ?Recommended yearly ophthalmology/optometry visit for glaucoma screening and checkup ?Recommended yearly dental visit for hygiene and checkup ? ?Vaccinations: ?Influenza vaccine will be due in Sept 2023 ?Pneumococcal vaccine -check to make sure you are up to date with your pneumonia vaccine at your pharmacy  ?Tdap vaccine up to date now- will be due in July 2023 ?Shingles vaccine up to date   ? ?Advanced directives: please bring advance directives to office so we can place on file.  ? ?Conditions/risks identified: advanced age ? ?Next appointment: yearly for AWV ? ?Preventive Care 37 Years and Older, Male ?Preventive care refers to lifestyle choices and visits with your health care provider that can promote health and wellness. ?What does preventive care include? ?A yearly physical exam. This is also called an annual well check. ?Dental exams once or twice a year. ?Routine eye exams. Ask your health care provider how often you should have your eyes checked. ?Personal lifestyle choices, including: ?Daily care of your teeth and gums. ?Regular physical activity. ?Eating a healthy diet. ?Avoiding tobacco and drug use. ?Limiting alcohol use. ?Practicing safe sex. ?Taking low doses of aspirin every day. ?Taking vitamin and mineral supplements as recommended by your health care provider. ?What happens during an annual well check? ?The services and screenings done by your health care provider during your annual well check will depend on your age, overall health, lifestyle risk factors, and family history of disease. ?Counseling  ?Your health care provider may ask you questions about your: ?Alcohol use. ?Tobacco use. ?Drug  use. ?Emotional well-being. ?Home and relationship well-being. ?Sexual activity. ?Eating habits. ?History of falls. ?Memory and ability to understand (cognition). ?Work and work Statistician. ?Screening  ?You may have the following tests or measurements: ?Height, weight, and BMI. ?Blood pressure. ?Lipid and cholesterol levels. These may be checked every 5 years, or more frequently if you are over 69 years old. ?Skin check. ?Lung cancer screening. You may have this screening every year starting at age 59 if you have a 30-pack-year history of smoking and currently smoke or have quit within the past 15 years. ?Fecal occult blood test (FOBT) of the stool. You may have this test every year starting at age 62. ?Flexible sigmoidoscopy or colonoscopy. You may have a sigmoidoscopy every 5 years or a colonoscopy every 10 years starting at age 10. ?Prostate cancer screening. Recommendations will vary depending on your family history and other risks. ?Hepatitis C blood test. ?Hepatitis B blood test. ?Sexually transmitted disease (STD) testing. ?Diabetes screening. This is done by checking your blood sugar (glucose) after you have not eaten for a while (fasting). You may have this done every 1-3 years. ?Abdominal aortic aneurysm (AAA) screening. You may need this if you are a current or former smoker. ?Osteoporosis. You may be screened starting at age 13 if you are at high risk. ?Talk with your health care provider about your test results, treatment options, and if necessary, the need for more tests. ?Vaccines  ?Your health care provider may recommend certain vaccines, such as: ?Influenza vaccine. This is recommended every year. ?Tetanus, diphtheria, and acellular pertussis (Tdap, Td) vaccine. You may need a Td booster every 10 years. ?Zoster vaccine. You may need this after age  60. ?Pneumococcal 13-valent conjugate (PCV13) vaccine. One dose is recommended after age 77. ?Pneumococcal polysaccharide (PPSV23) vaccine. One dose is  recommended after age 28. ?Talk to your health care provider about which screenings and vaccines you need and how often you need them. ?This information is not intended to replace advice given to you by your health care provider. Make sure you discuss any questions you have with your health care provider. ?Document Released: 03/23/2015 Document Revised: 11/14/2015 Document Reviewed: 12/26/2014 ?Elsevier Interactive Patient Education ? 2017 [employer]. ? ?Fall Prevention in the Home ?Falls can cause injuries. They can happen to people of all ages. There are many things you can do to make your home safe and to help prevent falls. ?What can I do on the outside of my home? ?Regularly fix the edges of walkways and driveways and fix any cracks. ?Remove anything that might make you trip as you walk through a door, such as a raised step or threshold. ?Trim any bushes or trees on the path to your home. ?Use bright outdoor lighting. ?Clear any walking paths of anything that might make someone trip, such as rocks or tools. ?Regularly check to see if handrails are loose or broken. Make sure that both sides of any steps have handrails. ?Any raised decks and porches should have guardrails on the edges. ?Have any leaves, snow, or ice cleared regularly. ?Use sand or salt on walking paths during winter. ?Clean up any spills in your garage right away. This includes oil or grease spills. ?What can I do in the bathroom? ?Use night lights. ?Install grab bars by the toilet and in the tub and shower. Do not use towel bars as grab bars. ?Use non-skid mats or decals in the tub or shower. ?If you need to sit down in the shower, use a plastic, non-slip stool. ?Keep the floor dry. Clean up any water that spills on the floor as soon as it happens. ?Remove soap buildup in the tub or shower regularly. ?Attach bath mats securely with double-sided non-slip rug tape. ?Do not have throw rugs and other things on the floor that can make you  trip. ?What can I do in the bedroom? ?Use night lights. ?Make sure that you have a light by your bed that is easy to reach. ?Do not use any sheets or blankets that are too big for your bed. They should not hang down onto the floor. ?Have a firm chair that has side arms. You can use this for support while you get dressed. ?Do not have throw rugs and other things on the floor that can make you trip. ?What can I do in the kitchen? ?Clean up any spills right away. ?Avoid walking on wet floors. ?Keep items that you use a lot in easy-to-reach places. ?If you need to reach something above you, use a strong step stool that has a grab bar. ?Keep electrical cords out of the way. ?Do not use floor polish or wax that makes floors slippery. If you must use wax, use non-skid floor wax. ?Do not have throw rugs and other things on the floor that can make you trip. ?What can I do with my stairs? ?Do not leave any items on the stairs. ?Make sure that there are handrails on both sides of the stairs and use them. Fix handrails that are broken or loose. Make sure that handrails are as long as the stairways. ?Check any carpeting to make sure that it is firmly attached to the stairs. Fix  any carpet that is loose or worn. ?Avoid having throw rugs at the top or bottom of the stairs. If you do have throw rugs, attach them to the floor with carpet tape. ?Make sure that you have a light switch at the top of the stairs and the bottom of the stairs. If you do not have them, ask someone to add them for you. ?What else can I do to help prevent falls? ?Wear shoes that: ?Do not have high heels. ?Have rubber bottoms. ?Are comfortable and fit you well. ?Are closed at the toe. Do not wear sandals. ?If you use a stepladder: ?Make sure that it is fully opened. Do not climb a closed stepladder. ?Make sure that both sides of the stepladder are locked into place. ?Ask someone to hold it for you, if possible. ?Clearly mark and make sure that you can  see: ?Any grab bars or handrails. ?First and last steps. ?Where the edge of each step is. ?Use tools that help you move around (mobility aids) if they are needed. These include: ?Canes. ?Walkers. ?Scooters. ?Crutches. ?T

## 2021-06-24 NOTE — Progress Notes (Signed)
? ?Subjective:  ? Peter Gross is a 88 y.o. male who presents for Medicare Annual/Subsequent preventive examination. ? ?Place of service: [facility] ? ?Review of Systems    ? ?Cardiac Risk Factors include: advanced age (>24mn, >>37women);hypertension ? ?   ?Objective:  ?  ?Today's Vitals  ? 06/24/21 1512  ?BP: 138/90  ?Pulse: 84  ?Temp: (!) 97.5 ?F (36.4 ?C)  ?TempSrc: Temporal  ?SpO2: 97%  ?Weight: 163 lb (73.9 kg)  ?Height: 5' 8.5" (1.74 m)  ? ?Body mass index is 24.42 kg/m?. ? ? ?  06/24/2021  ?  3:14 PM 04/15/2021  ?  1:26 PM 07/22/2014  ?  7:35 AM 10/01/2012  ?  1:19 PM  ?Advanced Directives  ?Does Patient Have a Medical Advance Directive? Yes Yes No Patient has advance directive, copy not in chart  ?Type of [place]of [place] will [place] will  Living will  ?Does patient want to make changes to medical advance directive? No - Patient declined No - Patient declined    ?Copy of [place] Chart? No - copy requested No - copy requested    ? ? ?Current Medications (verified) ?Outpatient Encounter Medications as of 06/24/2021  ?Medication Sig  ? aspirin EC 81 MG tablet Take 81 mg by mouth daily.  ? famotidine (PEPCID) 20 MG tablet One after supper  ? finasteride (PROSCAR) 5 MG tablet Take 5 mg by mouth daily.  ? Multiple Vitamins-Minerals (PRESERVISION AREDS PO) Take 1 capsule by mouth in the morning and at bedtime.  ? Tamsulosin HCl (FLOMAX) 0.4 MG CAPS Take 0.4 mg by mouth daily.  ? telmisartan (MICARDIS) 20 MG tablet TAKE 1/2 TABLET BY MOUTH DAILY  ? ?No facility-administered encounter medications on file as of 06/24/2021.  ? ? ?Allergies (verified) ?Patient has no known allergies.  ? ?History: ?Past Medical History:  ?Diagnosis Date  ? Arthritis   ? Hx: of in both knees  ? Benign prostatic hypertrophy   ? COPD (chronic obstructive pulmonary disease) ([place]   ? Early cataracts, bilateral   ? Hx: of  ? Enlarged  prostate   ? GERD (gastroesophageal reflux disease)   ? Hx:" of once in awhile"  ? Heart burn   ? Hypertension   ? Obesity   ? Prostate cancer ([place]   ? PVD (peripheral vascular disease) ([place]   ? ?Past Surgical History:  ?Procedure Laterality Date  ? COLONOSCOPY W/ BIOPSIES AND POLYPECTOMY    ? Hx: of   ? QUADRICEPS TENDON REPAIR Left 10/01/2012  ? Procedure: REPAIR QUADRICEP TENDON- left;  Surgeon: GMeredith Pel MD;  [facility]  Service: Orthopedics;  Laterality: Left;  ? ROTATOR CUFF REPAIR    ? TONSILLECTOMY    ? ?Family History  ?Problem Relation Age of Onset  ? Hypertension Mother   ? Dementia Father   ? Hemachromatosis Daughter   ? Colon cancer Daughter   ? ?Social History  ? ?Socioeconomic History  ? Marital status: Married  ?  Spouse name: Not on file  ? Number of children: 3  ? Years of education: Not on file  ? Highest education level: Not on file  ?Occupational History  ? Occupation: retired  ?Tobacco Use  ? Smoking status: Never  ? Smokeless tobacco: Never  ? Tobacco comments:  ?  "never really inhaled"  ?Vaping Use  ? Vaping Use: Never used  ?Substance and Sexual Activity  ? Alcohol use:  No  ?  Alcohol/week: 0.0 standard drinks  ? Drug use: No  ? Sexual activity: Not on file  ?Other Topics Concern  ? Not on file  ?Social History Narrative  ? Tobacco use, amount per day now: None  ? Past tobacco use, amount per day: None  ? How many years did you use tobacco: None  ? Alcohol use (drinks per week): None  ? Diet: None  ? Do you drink/eat things with caffeine: Yes  ? Marital status:   Married                               What year were you married? 1949  ? Do you live in a house, apartment, assisted living, condo, trailer, etc.? House  ? Is it one or more stories? 1 story.  ? How many persons live in your home? 2  ? Do you have pets in your home?( please list) No  ? Highest Level of education completed? High School.  ? Current or past profession: Lineman, Consulting civil engineer.  ? Do you  exercise?     No                             Type and how often? N/A  ? Do you have a living will? Yes  ? Do you have a DNR form?     No                              If not, do you want to discuss one?  ? Do you have signed POA/HPOA forms? Yes                        If so, please bring to you appointment  ?   ? Do you have any difficulty bathing or dressing yourself? No  ? Do you have any difficulty preparing food or eating? No  ? Do you have any difficulty managing your medications? No  ? Do you have any difficulty managing your finances? No  ? Do you have any difficulty affording your medications? No  ? ?Social Determinants of Health  ? ?Financial Resource Strain: Not on file  ?Food Insecurity: Not on file  ?Transportation Needs: Not on file  ?Physical Activity: Not on file  ?Stress: Not on file  ?Social Connections: Not on file  ? ? ?Tobacco Counseling ?Counseling given: Not Answered ?Tobacco comments: "never really inhaled" ? ? ?Clinical Intake: ? ?Pre-visit preparation completed: Yes ? ?Pain : No/denies pain ? ?  ? ?BMI - recorded: 24 ?Nutritional Status: BMI of 19-24  Normal ?Nutritional Risks: None ? ?How often do you need to have someone help you when you read instructions, pamphlets, or other written materials from your doctor or pharmacy?: 3 - Sometimes ? ?Diabetic?no ? ?  ? ?  ? ? ?Activities of Daily Living ? ?  06/24/2021  ?  3:32 PM  ?In your present state of health, do you have any difficulty performing the following activities:  ?Hearing? 1  ?Vision? 0  ?Difficulty concentrating or making decisions? 1  ?Comment some difficulty remembering things  ?Walking or climbing stairs? 0  ?Dressing or bathing? 0  ?Doing errands, shopping? 0  ?Preparing Food and eating ? N  ?Using the Toilet? N  ?In the past  six months, have you accidently leaked urine? N  ?Do you have problems with loss of bowel control? N  ?Managing your Medications? N  ?Managing your Finances? N  ?Comment daughter helps  ?Housekeeping or  managing your Housekeeping? N  ? ? ?Patient Care Team: ?Lauree Chandler, NP as PCP - General (Geriatric Medicine) ?[name], MD as Attending Physician (Urology) ? ?Indicate any recent Medical Services you may have received from other than [facility] providers in the past year (date may be approximate). ? ?   ?Assessment:  ? This is a routine wellness examination for [place]. ? ?Hearing/Vision screen ?Hearing Screening - Comments:: Wears hearing aids  ?Vision Screening - Comments:: Last eye exam less than 12 months ago, ? Name of eye docotor ? ?Dietary issues and exercise activities discussed: ?Current Exercise Habits: The patient does not participate in regular exercise at present ? ? Goals Addressed   ?None ?  ? ?Depression Screen ? ?  06/24/2021  ?  3:19 PM  ?PHQ 2/9 Scores  ?PHQ - 2 Score 0  ?  ?Fall Risk ? ?  06/24/2021  ?  3:19 PM 04/15/2021  ?  1:24 PM  ?Fall Risk   ?Falls in the past year? 0 0  ?Number falls in past yr: 0 0  ?Injury with Fall? 0 0  ?Risk for fall due to : No Fall Risks No Fall Risks  ?Follow up Falls evaluation completed Falls evaluation completed  ? ? ?FALL RISK PREVENTION PERTAINING TO THE HOME: ? ?Any stairs in or around the home? No  ?If so, are there any without handrails?  na ?Home free of loose throw rugs in walkways, pet beds, electrical cords, etc? Yes  ?Adequate lighting in your home to reduce risk of falls? Yes  ? ?ASSISTIVE DEVICES UTILIZED TO PREVENT FALLS: ? ?Life alert? No  ?Use of a cane, walker or w/c? No  ?Grab bars in the bathroom? Yes  ?Shower chair or bench in shower? Yes  ?Elevated toilet seat or a handicapped toilet? Yes  ? ?TIMED UP AND GO: ? ?Was the test performed? No .  ? ? ?Cognitive Function: ? ?  06/24/2021  ?  3:19 PM  ?MMSE - Mini Mental State Exam  ?Orientation to time 5  ?Orientation to Place 4  ?Orientation to Place-comments Did not know the name of provider  ?Registration 3  ?Attention/ Calculation 5  ?Recall 1  ?Recall-comments Missed apple and penny   ?Language- name 2 objects 2  ?Language- repeat 1  ?Language- follow 3 step command 3  ?Language- read & follow direction 1  ?Write a sentence 1  ?Copy design 1  ?Total score 27  ? ?  ?  ? ?Immunizations ?Immunization Hist

## 2021-06-28 ENCOUNTER — Encounter: Payer: Medicare Other | Admitting: Nurse Practitioner

## 2021-08-13 ENCOUNTER — Other Ambulatory Visit: Payer: Self-pay | Admitting: Physical Medicine and Rehabilitation

## 2021-08-13 ENCOUNTER — Other Ambulatory Visit: Payer: Self-pay | Admitting: Orthopaedic Surgery

## 2021-08-13 DIAGNOSIS — M545 Low back pain, unspecified: Secondary | ICD-10-CM

## 2021-08-13 DIAGNOSIS — M5416 Radiculopathy, lumbar region: Secondary | ICD-10-CM

## 2021-08-15 ENCOUNTER — Encounter: Payer: Self-pay | Admitting: Physical Medicine and Rehabilitation

## 2021-08-15 ENCOUNTER — Ambulatory Visit: Payer: Medicare Other | Admitting: Physical Medicine and Rehabilitation

## 2021-08-15 ENCOUNTER — Ambulatory Visit: Payer: Self-pay

## 2021-08-15 VITALS — BP 120/63 | HR 80

## 2021-08-15 DIAGNOSIS — M5416 Radiculopathy, lumbar region: Secondary | ICD-10-CM | POA: Diagnosis not present

## 2021-08-15 MED ORDER — METHYLPREDNISOLONE ACETATE 80 MG/ML IJ SUSP
80.0000 mg | Freq: Once | INTRAMUSCULAR | Status: AC
  Administered 2021-08-15: 80 mg

## 2021-08-15 NOTE — Patient Instructions (Signed)

## 2021-08-15 NOTE — Progress Notes (Unsigned)
Pt state lower back pain. Pt state getting out of bed in the morning is when he has the most pain. Pt state he takes over the counter pain meds and uses pain cream to help ease his pain.  Numeric Pain Rating Scale and Functional Assessment Average Pain 4   In the last MONTH (on 0-10 scale) has pain interfered with the following?  1. General activity like being  able to carry out your everyday physical activities such as walking, climbing stairs, carrying groceries, or moving a chair?  Rating(10)   +Driver, -BT, -Dye Allergies.

## 2021-08-30 NOTE — Procedures (Signed)
Lumbar Epidural Steroid Injection - Interlaminar Approach with Fluoroscopic Guidance  Patient: Peter Gross      Date of Birth: Mar 21, 1926 MRN: [id number] PCP: Sharon Seller, NP      Visit Date: 08/15/2021   Universal Protocol:     Consent Given By: the patient  Position: PRONE  Additional Comments: Vital signs were monitored before and after the procedure. Patient was prepped and draped in the usual sterile fashion. The correct patient, procedure, and site was verified.   Injection Procedure Details:   Procedure diagnoses: Lumbar radiculopathy [M54.16]   Meds Administered:  Meds ordered this encounter  Medications   methylPREDNISolone acetate (DEPO-MEDROL) injection 80 mg     Laterality: Right  Location/Site:  L5-S1  Needle: 3.5 in., 20 ga. Tuohy  Needle Placement: Paramedian epidural  Findings:   -Comments: Excellent flow of contrast into the epidural space.  Procedure Details: Using a paramedian approach from the side mentioned above, the region overlying the inferior lamina was localized under fluoroscopic visualization and the soft tissues overlying this structure were infiltrated with 4 ml. of 1% Lidocaine without Epinephrine. The Tuohy needle was inserted into the epidural space using a paramedian approach.   The epidural space was localized using loss of resistance along with counter oblique bi-planar fluoroscopic views.  After negative aspirate for air, blood, and CSF, a 2 ml. volume of Isovue-250 was injected into the epidural space and the flow of contrast was observed. Radiographs were obtained for documentation purposes.    The injectate was administered into the level noted above.   Additional Comments:  The patient tolerated the procedure well Dressing: 2 x 2 sterile gauze and Band-Aid    Post-procedure details: Patient was observed during the procedure. Post-procedure instructions were reviewed.  Patient left the clinic in stable  condition.

## 2021-11-04 ENCOUNTER — Ambulatory Visit: Payer: Medicare Other | Admitting: Orthopedic Surgery

## 2021-11-04 ENCOUNTER — Encounter: Payer: Self-pay | Admitting: Orthopedic Surgery

## 2021-11-04 ENCOUNTER — Ambulatory Visit: Payer: Self-pay

## 2021-11-04 DIAGNOSIS — M79671 Pain in right foot: Secondary | ICD-10-CM | POA: Diagnosis not present

## 2021-11-04 DIAGNOSIS — L02611 Cutaneous abscess of right foot: Secondary | ICD-10-CM

## 2021-11-04 MED ORDER — AMOXICILLIN-POT CLAVULANATE 875-125 MG PO TABS
1.0000 | ORAL_TABLET | Freq: Two times a day (BID) | ORAL | 0 refills | Status: DC
Start: 2021-11-04 — End: 2021-11-25

## 2021-11-04 NOTE — Progress Notes (Addendum)
Office Visit Note   Patient: Peter Gross           Date of Birth: September 04, 1926           MRN: [id number] Visit Date: 11/04/2021              Requested by: Lauree Chandler, NP [place],  [place] [id number] PCP: Lauree Chandler, NP  Chief Complaint  Patient presents with   Right Foot - Pain    Stepped on nail about 3 weeks ago       HPI: Patient is a 88 year old gentleman who states he stepped on a nail outside about 3 weeks ago.  Patient complains of increased pain redness and swelling plantar aspect right foot.  Patient feels like he had a tetanus booster 4 to 5 years ago.  Assessment & Plan: Visit Diagnoses:  1. Pain in right foot   2. Cutaneous abscess of right foot     Plan: Wound was packed open with iodoform gauze.  Patient will start Dial soap cleansing tomorrow.  Prescription called in for Augmentin.  Cultures sent.  Plan to follow-up on Thursday. Patient received tetanus booster   Follow-Up Instructions: Return in about 1 week (around 11/11/2021).   Ortho Exam  Patient is alert, oriented, no adenopathy, well-dressed, normal affect, normal respiratory effort. Examination patient has a palpable dorsalis pedis and posterior tibial pulse.  He has cellulitis and swelling on the plantar aspect the right foot beneath the fourth and fifth metatarsal shaft regions.  Patient has exquisite tenderness to light touch.  After informed consent patient underwent a regional block with 20 cc of 1% lidocaine plain.  After adequate levels anesthesia were obtained a 10 blade knife was used to make a 2 cm incision longitudinally on the plantar aspect of his foot.  There was purulent drainage.  This was drained and decompressed and the purulence was sent for cultures.  Wound was packed open with iodoform gauze sterile dressing was applied plus a postoperative shoe.  Imaging: No results found. No images are attached to the encounter.  Labs: Lab Results   Component Value Date   HGBA1C 6.4 (H) 04/15/2021   HGBA1C 6.4 04/26/2020   HGBA1C 6.4 09/21/2018     Lab Results  Component Value Date   ALBUMIN 4.3 08/10/2017   ALBUMIN 4.6 08/08/2016   ALBUMIN 4.2 12/21/2012    No results found for: "MG" No results found for: "VD25OH"  No results found for: "PREALBUMIN"    Latest Ref Rng & Units 04/15/2021    2:14 PM 04/26/2020    9:22 AM 08/10/2017    9:27 AM  CBC EXTENDED  WBC 3.8 - 10.8 Thousand/uL 8.2  7.5  8.1   RBC 4.20 - 5.80 Million/uL 4.73  4.68  4.72   Hemoglobin 13.2 - 17.1 g/dL 14.8  14.7  15.2   HCT 38.5 - 50.0 % 44.0  44.2  44.6   Platelets 140 - 400 Thousand/uL 264  244.0  214.0   NEUT# 1,500 - 7,800 cells/uL 5,117  4.8  5.3   Lymph# 850 - 3,900 cells/uL 2,001  1.7  1.8      There is no height or weight on file to calculate BMI.  Orders:  Orders Placed This Encounter  Procedures   Wound culture   XR Foot Complete Right   No orders of the defined types were placed in this encounter.    Procedures: No procedures performed  Clinical Data:  No additional findings.  ROS:  All other systems negative, except as noted in the HPI. Review of Systems  Objective: Vital Signs: There were no vitals taken for this visit.  Specialty Comments:  No specialty comments available.  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   Hemochromatosis, fm hx of (daughter)  02/09/2017   Hematochezia 10/02/2014   Patellar tendon rupture 10/01/2012   Heartburn 06/23/2012   Hyperlipidemia 12/18/2011   DIVERTICULOSIS OF COLON 08/01/2009   Pain in knee joint 02/07/2009   COPD GOLD I 10/29/2007   Diabetes mellitus type 2, diet-controlled ([place]) 08/16/2007   Essential hypertension 08/16/2007   Peripheral vascular disease ([place]) 08/16/2007   BPH with obstruction/lower urinary tract symptoms 08/16/2007   Past Medical History:  Diagnosis Date   Arthritis    Hx: of in both knees    Benign prostatic hypertrophy    COPD (chronic obstructive pulmonary disease) ([facility])    Early cataracts, bilateral    Hx: of   Enlarged prostate    GERD (gastroesophageal reflux disease)    Hx:" of once in awhile"   Heart burn    Hypertension    Obesity    Prostate cancer ([facility])    PVD (peripheral vascular disease) ([place])     Family History  Problem Relation Age of Onset   Hypertension Mother    Dementia Father    Hemachromatosis Daughter    Colon cancer Daughter     Past Surgical History:  Procedure Laterality Date   COLONOSCOPY W/ BIOPSIES AND POLYPECTOMY     Hx: of    QUADRICEPS TENDON REPAIR Left 10/01/2012   Procedure: REPAIR QUADRICEP TENDON- left;  Surgeon: Meredith Pel, MD;  Location: [facility];  Service: Orthopedics;  Laterality: Left;   ROTATOR CUFF REPAIR     TONSILLECTOMY     Social History   Occupational History   Occupation: retired  Tobacco Use   Smoking status: Never   Smokeless tobacco: Never   Tobacco comments:    "never really inhaled"  Vaping Use   Vaping Use: Never used  Substance and Sexual Activity   Alcohol use: No    Alcohol/week: 0.0 standard drinks of alcohol   Drug use: No   Sexual activity: Not on file

## 2021-11-07 ENCOUNTER — Ambulatory Visit: Payer: Medicare Other | Admitting: Orthopedic Surgery

## 2021-11-07 DIAGNOSIS — L02611 Cutaneous abscess of right foot: Secondary | ICD-10-CM

## 2021-11-07 LAB — WOUND CULTURE
MICRO NUMBER:: 13840237
SPECIMEN QUALITY:: ADEQUATE

## 2021-11-12 ENCOUNTER — Encounter: Payer: Self-pay | Admitting: Orthopedic Surgery

## 2021-11-12 NOTE — Progress Notes (Signed)
Office Visit Note   Patient: Peter Gross           Date of Birth: September 17, 1926           MRN: [id number] Visit Date: 11/07/2021              Requested by: Lauree Chandler, NP [place],  [place] [address] PCP: Lauree Chandler, NP  Chief Complaint  Patient presents with   Right Foot - Wound Check, Follow-up      HPI: Patient presents status post debridement abscess right foot on 11/04/2021.  Patient received a tetanus booster and has been on Augmentin.  Cultures shows the bacteria is sensitive to Augmentin.  Assessment & Plan: Visit Diagnoses:  1. Cutaneous abscess of right foot     Plan: Complete his antibiotics increase his activities as tolerated.  Follow-Up Instructions: Return if symptoms worsen or fail to improve.   Ortho Exam  Patient is alert, oriented, no adenopathy, well-dressed, normal affect, normal respiratory effort. Examination the cellulitis has resolved there is a small amount of clear serosanguineous drainage.  There is no tenderness to palpation there is no swelling.  Imaging: No results found. No images are attached to the encounter.  Labs: Lab Results  Component Value Date   HGBA1C 6.4 (H) 04/15/2021   HGBA1C 6.4 04/26/2020   HGBA1C 6.4 09/21/2018     Lab Results  Component Value Date   ALBUMIN 4.3 08/10/2017   ALBUMIN 4.6 08/08/2016   ALBUMIN 4.2 12/21/2012    No results found for: "MG" No results found for: "VD25OH"  No results found for: "PREALBUMIN"    Latest Ref Rng & Units 04/15/2021    2:14 PM 04/26/2020    9:22 AM 08/10/2017    9:27 AM  CBC EXTENDED  WBC 3.8 - 10.8 Thousand/uL 8.2  7.5  8.1   RBC 4.20 - 5.80 Million/uL 4.73  4.68  4.72   Hemoglobin 13.2 - 17.1 g/dL 14.8  14.7  15.2   HCT 38.5 - 50.0 % 44.0  44.2  44.6   Platelets 140 - 400 Thousand/uL 264  244.0  214.0   NEUT# 1,500 - 7,800 cells/uL 5,117  4.8  5.3   Lymph# 850 - 3,900 cells/uL 2,001  1.7  1.8      There is no height or weight  on file to calculate BMI.  Orders:  No orders of the defined types were placed in this encounter.  No orders of the defined types were placed in this encounter.    Procedures: No procedures performed  Clinical Data: No additional findings.  ROS:  All other systems negative, except as noted in the HPI. Review of Systems  Objective: Vital Signs: There were no vitals taken for this visit.  Specialty Comments:  No specialty comments available.  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   Hemochromatosis, fm hx of (daughter)  02/09/2017   Hematochezia 10/02/2014   Patellar tendon rupture 10/01/2012   Heartburn 06/23/2012   Hyperlipidemia 12/18/2011   DIVERTICULOSIS OF COLON 08/01/2009   Pain in knee joint 02/07/2009   COPD GOLD I 10/29/2007   Diabetes mellitus type 2, diet-controlled ([facility]) 08/16/2007   Essential hypertension 08/16/2007   Peripheral vascular disease ([facility]) 08/16/2007   BPH with obstruction/lower urinary tract symptoms 08/16/2007   Past Medical History:  Diagnosis Date   Arthritis    Hx: of in both knees   Benign  prostatic hypertrophy    COPD (chronic obstructive pulmonary disease) (HCC)    Early cataracts, bilateral    Hx: of   Enlarged prostate    GERD (gastroesophageal reflux disease)    Hx:" of once in awhile"   Heart burn    Hypertension    Obesity    Prostate cancer ([facility])    PVD (peripheral vascular disease) ([facility])     Family History  Problem Relation Age of Onset   Hypertension Mother    Dementia Father    Hemachromatosis Daughter    Colon cancer Daughter     Past Surgical History:  Procedure Laterality Date   COLONOSCOPY W/ BIOPSIES AND POLYPECTOMY     Hx: of    QUADRICEPS TENDON REPAIR Left 10/01/2012   Procedure: REPAIR QUADRICEP TENDON- left;  Surgeon: Meredith Pel, MD;  Location: [facility];  Service: Orthopedics;  Laterality: Left;   ROTATOR CUFF REPAIR     TONSILLECTOMY      Social History   Occupational History   Occupation: retired  Tobacco Use   Smoking status: Never   Smokeless tobacco: Never   Tobacco comments:    "never really inhaled"  Vaping Use   Vaping Use: Never used  Substance and Sexual Activity   Alcohol use: No    Alcohol/week: 0.0 standard drinks of alcohol   Drug use: No   Sexual activity: Not on file

## 2021-11-18 ENCOUNTER — Encounter: Payer: Self-pay | Admitting: Orthopedic Surgery

## 2021-11-18 ENCOUNTER — Ambulatory Visit: Payer: Medicare Other | Admitting: Orthopedic Surgery

## 2021-11-18 DIAGNOSIS — L02611 Cutaneous abscess of right foot: Secondary | ICD-10-CM | POA: Diagnosis not present

## 2021-11-18 NOTE — Progress Notes (Signed)
Office Visit Note   Patient: Peter Gross           Date of Birth: [birth year]           MRN: [id number] Visit Date: 11/18/2021              Requested by: Lauree Chandler, NP [place],  [place] [address] PCP: Lauree Chandler, NP  Chief Complaint  Patient presents with   Right Foot - Follow-up    S/p deb abscess 11/04/2021      HPI: Patient is a 88 year old gentleman who presents in follow-up status post debridement abscess right foot.  He has completed the Augmentin he is back to regular activities full weightbearing in a postoperative shoe he denies any pain or concerns.  Assessment & Plan: Visit Diagnoses:  1. Cutaneous abscess of right foot     Plan: Patient will advance to regular shoewear no restrictions.  Follow-Up Instructions: Return if symptoms worsen or fail to improve.   Ortho Exam  Patient is alert, oriented, no adenopathy, well-dressed, normal affect, normal respiratory effort. Examination the ulcer is completely healed there is no redness no cellulitis no drainage no signs of infection.  Imaging: No results found. No images are attached to the encounter.  Labs: Lab Results  Component Value Date   HGBA1C 6.4 (H) 04/15/2021   HGBA1C 6.4 04/26/2020   HGBA1C 6.4 09/21/2018     Lab Results  Component Value Date   ALBUMIN 4.3 08/10/2017   ALBUMIN 4.6 08/08/2016   ALBUMIN 4.2 12/21/2012    No results found for: "MG" No results found for: "VD25OH"  No results found for: "PREALBUMIN"    Latest Ref Rng & Units 04/15/2021    2:14 PM 04/26/2020    9:22 AM 08/10/2017    9:27 AM  CBC EXTENDED  WBC 3.8 - 10.8 Thousand/uL 8.2  7.5  8.1   RBC 4.20 - 5.80 Million/uL 4.73  4.68  4.72   Hemoglobin 13.2 - 17.1 g/dL 14.8  14.7  15.2   HCT 38.5 - 50.0 % 44.0  44.2  44.6   Platelets 140 - 400 Thousand/uL 264  244.0  214.0   NEUT# 1,500 - 7,800 cells/uL 5,117  4.8  5.3   Lymph# 850 - 3,900 cells/uL 2,001  1.7  1.8      There is  no height or weight on file to calculate BMI.  Orders:  No orders of the defined types were placed in this encounter.  No orders of the defined types were placed in this encounter.    Procedures: No procedures performed  Clinical Data: No additional findings.  ROS:  All other systems negative, except as noted in the HPI. Review of Systems  Objective: Vital Signs: There were no vitals taken for this visit.  Specialty Comments:  No specialty comments available.  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   Hemochromatosis, fm hx of (daughter)  02/09/2017   Hematochezia 10/02/2014   Patellar tendon rupture 10/01/2012   Heartburn 06/23/2012   Hyperlipidemia 12/18/2011   DIVERTICULOSIS OF COLON 08/01/2009   Pain in knee joint 02/07/2009   COPD GOLD I 10/29/2007   Diabetes mellitus type 2, diet-controlled ([facility]) 08/16/2007   Essential hypertension 08/16/2007   Peripheral vascular disease ([facility]) 08/16/2007   BPH with obstruction/lower urinary tract symptoms 08/16/2007   Past Medical History:  Diagnosis Date   Arthritis    Hx:  of in both knees   Benign prostatic hypertrophy    COPD (chronic obstructive pulmonary disease) (HCC)    Early cataracts, bilateral    Hx: of   Enlarged prostate    GERD (gastroesophageal reflux disease)    Hx:" of once in awhile"   Heart burn    Hypertension    Obesity    Prostate cancer ([facility])    PVD (peripheral vascular disease) ([facility])     Family History  Problem Relation Age of Onset   Hypertension Mother    Dementia Father    Hemachromatosis Daughter    Colon cancer Daughter     Past Surgical History:  Procedure Laterality Date   COLONOSCOPY W/ BIOPSIES AND POLYPECTOMY     Hx: of    QUADRICEPS TENDON REPAIR Left 10/01/2012   Procedure: REPAIR QUADRICEP TENDON- left;  Surgeon: Meredith Pel, MD;  Location: [facility];  Service: Orthopedics;  Laterality: Left;   ROTATOR CUFF REPAIR      TONSILLECTOMY     Social History   Occupational History   Occupation: retired  Tobacco Use   Smoking status: Never   Smokeless tobacco: Never   Tobacco comments:    "never really inhaled"  Vaping Use   Vaping Use: Never used  Substance and Sexual Activity   Alcohol use: No    Alcohol/week: 0.0 standard drinks of alcohol   Drug use: No   Sexual activity: Not on file

## 2021-11-25 ENCOUNTER — Ambulatory Visit: Payer: Medicare Other | Admitting: Nurse Practitioner

## 2021-11-25 ENCOUNTER — Encounter: Payer: Self-pay | Admitting: Nurse Practitioner

## 2021-11-25 VITALS — BP 130/70 | HR 60 | Temp 98.1°F | Ht 68.5 in | Wt 151.8 lb

## 2021-11-25 DIAGNOSIS — M159 Polyosteoarthritis, unspecified: Secondary | ICD-10-CM

## 2021-11-25 DIAGNOSIS — N401 Enlarged prostate with lower urinary tract symptoms: Secondary | ICD-10-CM | POA: Diagnosis not present

## 2021-11-25 DIAGNOSIS — E119 Type 2 diabetes mellitus without complications: Secondary | ICD-10-CM | POA: Diagnosis not present

## 2021-11-25 DIAGNOSIS — I10 Essential (primary) hypertension: Secondary | ICD-10-CM | POA: Diagnosis not present

## 2021-11-25 DIAGNOSIS — Z23 Encounter for immunization: Secondary | ICD-10-CM | POA: Diagnosis not present

## 2021-11-25 DIAGNOSIS — H3530 Unspecified macular degeneration: Secondary | ICD-10-CM

## 2021-11-25 DIAGNOSIS — N138 Other obstructive and reflux uropathy: Secondary | ICD-10-CM

## 2021-11-25 NOTE — Progress Notes (Signed)
Careteam: Patient Care Team: Lauree Chandler, NP as PCP - General (Geriatric Medicine) [name], MD as Attending Physician (Urology)  PLACE OF SERVICE:  [facility] Directive information    No Known Allergies  Chief Complaint  Patient presents with   Medical Management of Chronic Issues    Patient presents today for a 7 month follow-up.   Quality Metric Gaps    Foot & eye exam, A1C, pneumonia, TDAP, COVID#4     HPI: Patient is a 88 y.o. male for routine follow up.   Stepped on a nail a few weeks ago, got an updated Td shot.  Then it got infected a few weeks later, had to get it lanced and now doing okay  Doing well,occasionally low back pain in the morning but does okay otherwise.   Ongoing follow up with urology, no changes in frequency or flow.   Blood pressure has been well controlled.    Review of Systems:  Review of Systems  Constitutional:  Negative for chills, fever and weight loss.  HENT:  Negative for tinnitus.   Respiratory:  Negative for cough, sputum production and shortness of breath.   Cardiovascular:  Negative for chest pain, palpitations and leg swelling.  Gastrointestinal:  Negative for abdominal pain, constipation, diarrhea and heartburn.  Genitourinary:  Negative for dysuria, frequency and urgency.  Musculoskeletal:  Negative for back pain, falls, joint pain and myalgias.  Skin: Negative.   Neurological:  Negative for dizziness and headaches.  Psychiatric/Behavioral:  Negative for depression and memory loss. The patient does not have insomnia.     Past Medical History:  Diagnosis Date   Arthritis    Hx: of in both knees   Benign prostatic hypertrophy    COPD (chronic obstructive pulmonary disease) (HCC)    Early cataracts, bilateral    Hx: of   Enlarged prostate    GERD (gastroesophageal reflux disease)    Hx:" of once in awhile"   Heart burn    Hypertension    Obesity    Prostate cancer ([facility])    PVD (peripheral  vascular disease) ([facility])    Past Surgical History:  Procedure Laterality Date   COLONOSCOPY W/ BIOPSIES AND POLYPECTOMY     Hx: of    QUADRICEPS TENDON REPAIR Left 10/01/2012   Procedure: REPAIR QUADRICEP TENDON- left;  Surgeon: Meredith Pel, MD;  Location: [facility];  Service: Orthopedics;  Laterality: Left;   ROTATOR CUFF REPAIR     TONSILLECTOMY     Social History:   reports that he has never smoked. He has never used smokeless tobacco. He reports that he does not drink alcohol and does not use drugs.  Family History  Problem Relation Age of Onset   Hypertension Mother    Dementia Father    Hemachromatosis Daughter    Colon cancer Daughter     Medications: Patient's Medications  New Prescriptions   No medications on file  Previous Medications   ASPIRIN EC 81 MG TABLET    Take 81 mg by mouth daily.   FINASTERIDE (PROSCAR) 5 MG TABLET    Take 5 mg by mouth daily.   MULTIPLE VITAMINS-MINERALS (PRESERVISION AREDS PO)    Take 1 capsule by mouth in the morning and at bedtime.   TAMSULOSIN HCL (FLOMAX) 0.4 MG CAPS    Take 0.4 mg by mouth daily.   TELMISARTAN (MICARDIS) 20 MG TABLET    TAKE 1/2 TABLET BY MOUTH DAILY  Modified Medications  No medications on file  Discontinued Medications   AMOXICILLIN-CLAVULANATE (AUGMENTIN) 875-125 MG TABLET    Take 1 tablet by mouth 2 (two) times daily.   FAMOTIDINE (PEPCID) 20 MG TABLET    One after supper    Physical Exam:  Vitals:   11/25/21 1511  BP: 130/70  Pulse: 60  Temp: 98.1 F (36.7 C)  SpO2: 98%  Weight: 151 lb 12.8 oz (68.9 kg)  Height: 5' 8.5" (1.74 m)   Body mass index is 22.75 kg/m. Wt Readings from Last 3 Encounters:  11/25/21 151 lb 12.8 oz (68.9 kg)  06/24/21 163 lb (73.9 kg)  04/15/21 165 lb 12.8 oz (75.2 kg)    Physical Exam Constitutional:      General: He is not in acute distress.    Appearance: He is well-developed. He is not diaphoretic.  HENT:     Head: Normocephalic and atraumatic.     Right Ear:  External ear normal.     Left Ear: External ear normal.     Mouth/Throat:     Pharynx: No oropharyngeal exudate.  Eyes:     Conjunctiva/sclera: Conjunctivae normal.     Pupils: Pupils are equal, round, and reactive to light.  Cardiovascular:     Rate and Rhythm: Normal rate and regular rhythm.     Heart sounds: Normal heart sounds.  Pulmonary:     Effort: Pulmonary effort is normal.     Breath sounds: Normal breath sounds.  Abdominal:     General: Bowel sounds are normal.     Palpations: Abdomen is soft.  Musculoskeletal:        General: No tenderness.     Cervical back: Normal range of motion and neck supple.     Right lower leg: No edema.     Left lower leg: No edema.  Skin:    General: Skin is warm and dry.  Neurological:     Mental Status: He is alert and oriented to person, place, and time.     Labs reviewed: Basic Metabolic Panel: Recent Labs    04/15/21 1414  NA 140  K 4.9  CL 101  CO2 31  GLUCOSE 134  BUN 13  CREATININE 1.05  CALCIUM 9.9   Liver Function Tests: Recent Labs    04/15/21 1414  AST 17  ALT 12  BILITOT 0.9  PROT 6.9   No results for input(s): "LIPASE", "AMYLASE" in the last 8760 hours. No results for input(s): "AMMONIA" in the last 8760 hours. CBC: Recent Labs    04/15/21 1414  WBC 8.2  NEUTROABS 5,117  HGB 14.8  HCT 44.0  MCV 93.0  PLT 264   Lipid Panel: No results for input(s): "CHOL", "HDL", "LDLCALC", "TRIG", "CHOLHDL", "LDLDIRECT" in the last 8760 hours. TSH: No results for input(s): "TSH" in the last 8760 hours. A1C: Lab Results  Component Value Date   HGBA1C 6.4 (H) 04/15/2021     Assessment/Plan 1. Essential hypertension -Blood pressure well controlled, goal bp <140/90 Continue current medications and dietary modifications follow metabolic panel - CBC with Differential/Platelet - CMP with eGFR([facility])  2. Diabetes mellitus type 2, diet-controlled ([facility]) -Encouraged dietary compliance, routine foot  care/monitoring and to keep up with diabetic eye exams through ophthalmology  - Hemoglobin A1c  3. BPH with obstruction/lower urinary tract symptoms -stable, continues to follow up urology -continues on proscar and flomax  4. Macular degeneration, unspecified laterality, unspecified type -continues to follow up with ophthalmology -continues on preservation   5. Primary osteoarthritis  involving multiple joints -stable at this time, continues to remained very active.   6. Need for influenza vaccination - Flu Vaccine QUAD High Dose(Fluad)  7. Need for pneumococcal 20-valent conjugate vaccination - Pneumococcal conjugate vaccine 20-valent (Prevnar 20)   Return in about 6 months (around 05/26/2022) for routine follow up . Carlos American. [place], [facility] [phone]

## 2021-11-26 LAB — COMPLETE METABOLIC PANEL WITH GFR
AG Ratio: 1.6 (calc) (ref 1.0–2.5)
ALT: 9 U/L (ref 9–46)
AST: 15 U/L (ref 10–35)
Albumin: 4.1 g/dL (ref 3.6–5.1)
Alkaline phosphatase (APISO): 75 U/L (ref 35–144)
BUN: 10 mg/dL (ref 7–25)
CO2: 27 mmol/L (ref 20–32)
Calcium: 9.5 mg/dL (ref 8.6–10.3)
Chloride: 103 mmol/L (ref 98–110)
Creat: 0.94 mg/dL (ref 0.70–1.22)
Globulin: 2.5 g/dL (calc) (ref 1.9–3.7)
Glucose, Bld: 103 mg/dL (ref 65–139)
Potassium: 4.8 mmol/L (ref 3.5–5.3)
Sodium: 139 mmol/L (ref 135–146)
Total Bilirubin: 0.9 mg/dL (ref 0.2–1.2)
Total Protein: 6.6 g/dL (ref 6.1–8.1)
eGFR: 76 mL/min/{1.73_m2} (ref >=60)

## 2021-11-26 LAB — CBC WITH DIFFERENTIAL/PLATELET
Absolute Monocytes: 713 cells/uL (ref 200–950)
Basophils Absolute: 73 cells/uL (ref 0–200)
Basophils Relative: 0.9 %
Eosinophils Absolute: 162 cells/uL (ref 15–500)
Eosinophils Relative: 2 %
HCT: 42.2 % (ref 38.5–50.0)
Hemoglobin: 14.3 g/dL (ref 13.2–17.1)
Lymphs Abs: 2195 cells/uL (ref 850–3900)
MCH: 31.1 pg (ref 27.0–33.0)
MCHC: 33.9 g/dL (ref 32.0–36.0)
MCV: 91.7 fL (ref 80.0–100.0)
MPV: 10.2 fL (ref 7.5–12.5)
Monocytes Relative: 8.8 %
Neutro Abs: 4957 cells/uL (ref 1500–7800)
Neutrophils Relative %: 61.2 %
Platelets: 233 10*3/uL (ref 140–400)
RBC: 4.6 10*6/uL (ref 4.20–5.80)
RDW: 13.1 % (ref 11.0–15.0)
Total Lymphocyte: 27.1 %
WBC: 8.1 10*3/uL (ref 3.8–10.8)

## 2021-11-26 LAB — HEMOGLOBIN A1C
Hgb A1c MFr Bld: 5.9 % of total Hgb — ABNORMAL HIGH (ref <5.7)
Mean Plasma Glucose: 123 mg/dL
eAG (mmol/L): 6.8 mmol/L

## 2022-05-12 ENCOUNTER — Other Ambulatory Visit: Payer: Self-pay | Admitting: Internal Medicine

## 2022-05-12 ENCOUNTER — Encounter: Payer: Self-pay | Admitting: Nurse Practitioner

## 2022-05-12 ENCOUNTER — Ambulatory Visit: Payer: Medicare Other | Admitting: Nurse Practitioner

## 2022-05-12 VITALS — BP 124/80 | HR 64 | Temp 97.9°F | Ht 69.0 in | Wt 162.0 lb

## 2022-05-12 DIAGNOSIS — M159 Polyosteoarthritis, unspecified: Secondary | ICD-10-CM

## 2022-05-12 DIAGNOSIS — E119 Type 2 diabetes mellitus without complications: Secondary | ICD-10-CM

## 2022-05-12 DIAGNOSIS — I10 Essential (primary) hypertension: Secondary | ICD-10-CM | POA: Diagnosis not present

## 2022-05-12 DIAGNOSIS — N401 Enlarged prostate with lower urinary tract symptoms: Secondary | ICD-10-CM

## 2022-05-12 DIAGNOSIS — N138 Other obstructive and reflux uropathy: Secondary | ICD-10-CM

## 2022-05-12 NOTE — Progress Notes (Unsigned)
Careteam: Patient Care Team: Peter Chandler, NP as PCP - General (Geriatric Medicine) Peter Seal, MD as Attending Physician (Urology)  PLACE OF SERVICE:  [facility] Directive information Does Patient Have a Medical Advance Directive?: Yes, Type of Advance Directive: [place];Living will, Does patient want to make changes to medical advance directive?: No - Patient declined  No Known Allergies  Chief Complaint  Patient presents with   Medical Management of Chronic Issues    6 month follow-up and foot exam today. Discuss need for eye exam, and covid boosters or post pone if patient refuses or is not a candidate. Patient c/o left knee pain      HPI: Patient is a 89 y.o. male here for follow-up.   Reports he is doing great.   He is urinating well his medications are helping and he sees Dr. Jeffie Gross for this.   He's eating well and weight is stable.   Gets shots in his L eye every few months.   Has had some mild L knee pain but it isn't bothersome enough for him to take medication or treat at this time.   Review of Systems:  Review of Systems  Constitutional:  Negative for chills, fever, malaise/fatigue and weight loss.  HENT:  Negative for congestion and sore throat.   Eyes:  Negative for blurred vision.  Respiratory:  Negative for cough, shortness of breath and wheezing.   Cardiovascular:  Negative for chest pain, palpitations and leg swelling.  Gastrointestinal:  Negative for abdominal pain, blood in stool, constipation, diarrhea, heartburn, nausea and vomiting.  Genitourinary:  Negative for dysuria, frequency, hematuria and urgency.  Musculoskeletal:  Positive for joint pain. Negative for falls.       L knee pain, mild  Skin:  Negative for rash.  Neurological:  Negative for dizziness, tingling and headaches.  Endo/Heme/Allergies:  Negative for polydipsia.  Psychiatric/Behavioral:  Negative for depression. The patient is not  nervous/anxious.   ***  Past Medical History:  Diagnosis Date   Arthritis    Hx: of in both knees   Benign prostatic hypertrophy    COPD (chronic obstructive pulmonary disease) (HCC)    Early cataracts, bilateral    Hx: of   Enlarged prostate    GERD (gastroesophageal reflux disease)    Hx:" of once in awhile"   Heart burn    Hypertension    Obesity    Prostate cancer ([facility])    PVD (peripheral vascular disease) ([facility])    Past Surgical History:  Procedure Laterality Date   COLONOSCOPY W/ BIOPSIES AND POLYPECTOMY     Hx: of    QUADRICEPS TENDON REPAIR Left 10/01/2012   Procedure: REPAIR QUADRICEP TENDON- left;  Surgeon: Meredith Pel, MD;  Location: [facility];  Service: Orthopedics;  Laterality: Left;   ROTATOR CUFF REPAIR     TONSILLECTOMY     Social History:   reports that he has never smoked. He has never used smokeless tobacco. He reports that he does not drink alcohol and does not use drugs.  Family History  Problem Relation Age of Onset   Hypertension Mother    Dementia Father    Hemachromatosis Daughter    Colon cancer Daughter     Medications: Patient's Medications  New Prescriptions   No medications on file  Previous Medications   ASPIRIN EC 81 MG TABLET    Take 81 mg by mouth daily.   FAMOTIDINE (PEPCID) 40 MG TABLET  Take 40 mg by mouth daily.   FINASTERIDE (PROSCAR) 5 MG TABLET    Take 5 mg by mouth daily.   MULTIPLE VITAMINS-MINERALS (PRESERVISION AREDS PO)    Take 1 capsule by mouth in the morning and at bedtime.   TAMSULOSIN HCL (FLOMAX) 0.4 MG CAPS    Take 0.4 mg by mouth daily.   TELMISARTAN (MICARDIS) 20 MG TABLET    TAKE 1/2 TABLET BY MOUTH DAILY  Modified Medications   No medications on file  Discontinued Medications   No medications on file    Physical Exam:  Vitals:   05/12/22 1345 05/12/22 1413  BP: 124/80   Pulse: (!) 103 64  Temp: 97.9 F (36.6 C)   TempSrc: Temporal   SpO2: 99%   Weight: 162 lb (73.5 kg)   Height: '5\' 9"'$   (1.753 m)    Body mass index is 23.92 kg/m. Wt Readings from Last 3 Encounters:  05/12/22 162 lb (73.5 kg)  11/25/21 151 lb 12.8 oz (68.9 kg)  06/24/21 163 lb (73.9 kg)    Physical Exam Vitals and nursing note reviewed. Exam conducted with a chaperone present.  Cardiovascular:     Rate and Rhythm: Normal rate and regular rhythm.     Heart sounds: Normal heart sounds. No murmur heard. Pulmonary:     Breath sounds: Normal breath sounds. No wheezing or rales.  Abdominal:     General: Bowel sounds are normal.     Palpations: Abdomen is soft. There is no mass.     Tenderness: There is no abdominal tenderness. There is no guarding.  Musculoskeletal:        General: No swelling or tenderness.  Skin:    General: Skin is warm and dry.  Neurological:     Mental Status: He is alert and oriented to person, place, and time. Mental status is at baseline.  Psychiatric:        Mood and Affect: Mood normal.        Behavior: Behavior normal.        Judgment: Judgment normal.   ***  Labs reviewed: Basic Metabolic Panel: Recent Labs    11/25/21 1540  NA 139  K 4.8  CL 103  CO2 27  GLUCOSE 103  BUN 10  CREATININE 0.94  CALCIUM 9.5   Liver Function Tests: Recent Labs    11/25/21 1540  AST 15  ALT 9  BILITOT 0.9  PROT 6.6   No results for input(s): "LIPASE", "AMYLASE" in the last 8760 hours. No results for input(s): "AMMONIA" in the last 8760 hours. CBC: Recent Labs    11/25/21 1540  WBC 8.1  NEUTROABS 4,957  HGB 14.3  HCT 42.2  MCV 91.7  PLT 233   Lipid Panel: No results for input(s): "CHOL", "HDL", "LDLCALC", "TRIG", "CHOLHDL", "LDLDIRECT" in the last 8760 hours. TSH: No results for input(s): "TSH" in the last 8760 hours. A1C: Lab Results  Component Value Date   HGBA1C 5.9 (H) 11/25/2021     Assessment/Plan 1. Essential hypertension BP great today. Continue losartan - CBC with Differential/Platelet - Complete Metabolic Panel with eGFR  2. Diabetes  mellitus type 2, diet-controlled (HCC) A1C was improved at last visit. Diet-controlled. Monitor - Hemoglobin A1c  3. BPH with obstruction/lower urinary tract symptoms Pt is doing well on flomax and proscar managed by Dr. Jeffie Gross.  4. Primary osteoarthritis involving multiple joints Pt c/o minimal L knee pain at this time but declines medication or therapy at this time. Follow-up as  needed.   Return in about 6 months (around 11/12/2022) for routine follow up .: ***  Student- Archer Asa O'Berry ACPCNP-S  I personally was present during the history, physical exam and medical decision-making activities of this service and have verified that the service and findings are accurately documented in the student's note [place] Fritze K. [place], [facility] [phone]

## 2022-05-12 NOTE — Patient Instructions (Addendum)
To call us [phone] and let us know who your ophthalmologist is.

## 2022-05-13 LAB — COMPLETE METABOLIC PANEL WITH GFR
AG Ratio: 1.7 (calc) (ref 1.0–2.5)
ALT: 13 U/L (ref 9–46)
AST: 20 U/L (ref 10–35)
Albumin: 4.1 g/dL (ref 3.6–5.1)
Alkaline phosphatase (APISO): 73 U/L (ref 35–144)
BUN: 12 mg/dL (ref 7–25)
CO2: 28 mmol/L (ref 20–32)
Calcium: 9.4 mg/dL (ref 8.6–10.3)
Chloride: 101 mmol/L (ref 98–110)
Creat: 1.06 mg/dL (ref 0.70–1.22)
Globulin: 2.4 g/dL (calc) (ref 1.9–3.7)
Glucose, Bld: 119 mg/dL — ABNORMAL HIGH (ref 65–99)
Potassium: 4.6 mmol/L (ref 3.5–5.3)
Sodium: 139 mmol/L (ref 135–146)
Total Bilirubin: 0.7 mg/dL (ref 0.2–1.2)
Total Protein: 6.5 g/dL (ref 6.1–8.1)
eGFR: 65 mL/min/{1.73_m2} (ref >=60)

## 2022-05-13 LAB — CBC WITH DIFFERENTIAL/PLATELET
Absolute Monocytes: 865 cells/uL (ref 200–950)
Basophils Absolute: 56 cells/uL (ref 0–200)
Basophils Relative: 0.6 %
Eosinophils Absolute: 160 cells/uL (ref 15–500)
Eosinophils Relative: 1.7 %
HCT: 43.8 % (ref 38.5–50.0)
Hemoglobin: 14.6 g/dL (ref 13.2–17.1)
Lymphs Abs: 2613 cells/uL (ref 850–3900)
MCH: 30.8 pg (ref 27.0–33.0)
MCHC: 33.3 g/dL (ref 32.0–36.0)
MCV: 92.4 fL (ref 80.0–100.0)
MPV: 10.1 fL (ref 7.5–12.5)
Monocytes Relative: 9.2 %
Neutro Abs: 5706 cells/uL (ref 1500–7800)
Neutrophils Relative %: 60.7 %
Platelets: 260 10*3/uL (ref 140–400)
RBC: 4.74 10*6/uL (ref 4.20–5.80)
RDW: 12.8 % (ref 11.0–15.0)
Total Lymphocyte: 27.8 %
WBC: 9.4 10*3/uL (ref 3.8–10.8)

## 2022-05-13 LAB — HEMOGLOBIN A1C
Hgb A1c MFr Bld: 6.4 % of total Hgb — ABNORMAL HIGH (ref <5.7)
Mean Plasma Glucose: 137 mg/dL
eAG (mmol/L): 7.6 mmol/L

## 2022-06-27 ENCOUNTER — Ambulatory Visit (INDEPENDENT_AMBULATORY_CARE_PROVIDER_SITE_OTHER): Payer: Medicare Other | Admitting: Nurse Practitioner

## 2022-06-27 ENCOUNTER — Encounter: Payer: Self-pay | Admitting: Nurse Practitioner

## 2022-06-27 VITALS — BP 122/88 | HR 79 | Temp 97.1°F | Ht 68.5 in | Wt 160.8 lb

## 2022-06-27 DIAGNOSIS — Z0000 Encounter for general adult medical examination without abnormal findings: Secondary | ICD-10-CM | POA: Diagnosis not present

## 2022-06-27 NOTE — Progress Notes (Signed)
Subjective:   Peter Gross is a 89 y.o. male who presents for Medicare Annual/Subsequent preventive examination.  Review of Systems           Objective:    Today's Vitals   06/27/22 1457  BP: 122/88  Pulse: 79  Temp: (!) 97.1 F (36.2 C)  TempSrc: Temporal  SpO2: 98%  Weight: 160 lb 12.8 oz (72.9 kg)  Height: 5' 8.5" (1.74 m)   Body mass index is 24.09 kg/m.     06/27/2022    3:01 PM 05/12/2022    1:49 PM 06/24/2021    3:14 PM 04/15/2021    1:26 PM 07/22/2014    7:35 AM 10/01/2012    1:19 PM  Advanced Directives  Does Patient Have a Medical Advance Directive? Yes Yes Yes Yes No Patient has advance directive, copy not in chart  Type of Advance Directive Healthcare Power of [place];Living will Healthcare Power of [place];Living will Healthcare Power of [place];Living will Healthcare Power of [place];Living will  Living will  Does patient want to make changes to medical advance directive? No - Patient declined No - Patient declined No - Patient declined No - Patient declined    Copy of Healthcare Power of Attorney in Chart? No - copy requested No - copy requested No - copy requested No - copy requested      Current Medications (verified) Outpatient Encounter Medications as of 06/27/2022  Medication Sig   aspirin EC 81 MG tablet Take 81 mg by mouth daily.   famotidine (PEPCID) 40 MG tablet Take 40 mg by mouth daily.   finasteride (PROSCAR) 5 MG tablet Take 5 mg by mouth daily.   Multiple Vitamins-Minerals (PRESERVISION AREDS PO) Take 1 capsule by mouth in the morning and at bedtime.   Tamsulosin HCl (FLOMAX) 0.4 MG CAPS Take 0.4 mg by mouth daily.   telmisartan (MICARDIS) 20 MG tablet TAKE 1/2 TABLET BY MOUTH DAILY   No facility-administered encounter medications on file as of 06/27/2022.    Allergies (verified) Patient has no known allergies.   History: Past Medical History:  Diagnosis Date   Arthritis    Hx: of in both knees   Benign prostatic  hypertrophy    COPD (chronic obstructive pulmonary disease)    Early cataracts, bilateral    Hx: of   Enlarged prostate    GERD (gastroesophageal reflux disease)    Hx:" of once in awhile"   Heart burn    Hypertension    Obesity    Prostate cancer    PVD (peripheral vascular disease)    Past Surgical History:  Procedure Laterality Date   COLONOSCOPY W/ BIOPSIES AND POLYPECTOMY     Hx: of    QUADRICEPS TENDON REPAIR Left 10/01/2012   Procedure: REPAIR QUADRICEP TENDON- left;  Surgeon: Cammy Copa, MD;  Location: [facility];  Service: Orthopedics;  Laterality: Left;   ROTATOR CUFF REPAIR     TONSILLECTOMY     Family History  Problem Relation Age of Onset   Hypertension Mother    Dementia Father    Hemachromatosis Daughter    Colon cancer Daughter    Social History   Socioeconomic History   Marital status: Married    Spouse name: Not on file   Number of children: 3   Years of education: Not on file   Highest education level: Not on file  Occupational History   Occupation: retired  Tobacco Use   Smoking status: Never   Smokeless tobacco: Never  Tobacco comments:    "never really inhaled"  Vaping Use   Vaping Use: Never used  Substance and Sexual Activity   Alcohol use: No    Alcohol/week: 0.0 standard drinks of alcohol   Drug use: No   Sexual activity: Not on file  Other Topics Concern   Not on file  Social History Narrative   Tobacco use, amount per day now: None   Past tobacco use, amount per day: None   How many years did you use tobacco: None   Alcohol use (drinks per week): None   Diet: None   Do you drink/eat things with caffeine: Yes   Marital status:   Married                               What year were you married? 1949   Do you live in a house, apartment, assisted living, condo, trailer, etc.? House   Is it one or more stories? 1 story.   How many persons live in your home? 2   Do you have pets in your home?( please list) No   Highest Level of  education completed? High School.   Current or past profession: Lineman, Theme park manager.   Do you exercise?     No                             Type and how often? N/A   Do you have a living will? Yes   Do you have a DNR form?     No                              If not, do you want to discuss one?   Do you have signed POA/HPOA forms? Yes                        If so, please bring to you appointment      Do you have any difficulty bathing or dressing yourself? No   Do you have any difficulty preparing food or eating? No   Do you have any difficulty managing your medications? No   Do you have any difficulty managing your finances? No   Do you have any difficulty affording your medications? No   Social Determinants of Corporate investment banker Strain: Not on file  Food Insecurity: Not on file  Transportation Needs: Not on file  Physical Activity: Not on file  Stress: Not on file  Social Connections: Not on file    Tobacco Counseling Counseling given: Not Answered Tobacco comments: "never really inhaled"   Clinical Intake:                 Diabetic?no         Activities of Daily Living    06/27/2022    3:15 PM  In your present state of health, do you have any difficulty performing the following activities:  Hearing? 1  Walking or climbing stairs? 1    Patient Care Team: Sharon Seller, NP as PCP - General (Geriatric Medicine) Bjorn Pippin, MD as Attending Physician (Urology)  Indicate any recent Medical Services you may have received from other than [facility] providers in the past year (date may be approximate).     Assessment:   This is  a routine wellness examination for [place].  Hearing/Vision screen Hearing Screening - Comments:: Wears hearing aids  Vision Screening - Comments:: Last eye exam was less than 12 months ago, patient unable to recall eye doctors name  Dietary issues and exercise activities discussed:     Goals Addressed    None    Depression Screen    06/27/2022    2:59 PM 05/12/2022    1:48 PM 06/24/2021    3:19 PM  PHQ 2/9 Scores  PHQ - 2 Score 0 0 0    Fall Risk    06/27/2022    2:58 PM 05/12/2022    1:48 PM 11/25/2021    3:15 PM 06/24/2021    3:19 PM 04/15/2021    1:24 PM  Fall Risk   Falls in the past year? 0 0 0 0 0  Number falls in past yr: 0 0 0 0 0  Injury with Fall? 0 0 0 0 0  Risk for fall due to : No Fall Risks  No Fall Risks No Fall Risks No Fall Risks  Follow up Falls evaluation completed Falls evaluation completed  Falls evaluation completed Falls evaluation completed    FALL RISK PREVENTION PERTAINING TO THE HOME:  Any stairs in or around the home? No  If so, are there any without handrails? No  Home free of loose throw rugs in walkways, pet beds, electrical cords, etc? Yes  Adequate lighting in your home to reduce risk of falls? Yes   ASSISTIVE DEVICES UTILIZED TO PREVENT FALLS:  Life alert? No  Use of a cane, walker or w/c? No  Grab bars in the bathroom? Yes  Shower chair or bench in shower? Yes  Elevated toilet seat or a handicapped toilet? Yes   TIMED UP AND GO:  Was the test performed? No .    Cognitive Function:    06/27/2022    3:04 PM 06/24/2021    3:19 PM  MMSE - Mini Mental State Exam  Orientation to time 5 5  Orientation to Place 4 4  Orientation to Place-comments  Did not know the name of provider  Registration 3 3  Attention/ Calculation 1 5  Recall 1 1  Recall-comments  Missed apple and penny  Language- name 2 objects 2 2  Language- repeat 1 1  Language- follow 3 step command 3 3  Language- read & follow direction 1 1  Write a sentence 1 1  Copy design 0 1  Total score 22 27        Immunizations Immunization History  Administered Date(s) Administered   19-influenza Whole 12/11/2011   Fluad Quad(high Dose 65+) 11/25/2021   Influenza Split 12/10/2012, 12/12/2013, 12/09/2014   Influenza Whole 02/07/2009, 12/09/2015, 12/08/2016   Influenza,  High Dose Seasonal PF 10/23/2016, 12/17/2017   Influenza,inj,Quad PF,6+ Mos 01/09/2019   Influenza-Unspecified 12/28/2018, 11/15/2020   PFIZER(Purple Top)SARS-COV-2 Vaccination 03/29/2019, 04/19/2019   PNEUMOCOCCAL CONJUGATE-20 11/25/2021   Pneumococcal Conjugate-13 12/26/2013   Td 08/21/2004, 11/04/2021   Tdap 09/27/2011   Unspecified SARS-COV-2 Vaccination 12/08/2019   Zoster Recombinat (Shingrix) 08/15/2020, 11/12/2020    TDAP status: Up to date  Flu Vaccine status: Up to date  Pneumococcal vaccine status: Up to date  Covid-19 vaccine status: Information provided on how to obtain vaccines.   Qualifies for Shingles Vaccine? Yes   Zostavax completed No   Shingrix Completed?: Yes  Screening Tests Health Maintenance  Topic Date Due   OPHTHALMOLOGY EXAM  Never done   COVID-19 Vaccine (4 -  2023-24 season) 11/08/2021   INFLUENZA VACCINE  10/09/2022   HEMOGLOBIN A1C  11/12/2022   FOOT EXAM  05/12/2023   Medicare Annual Wellness (AWV)  06/27/2023   DTaP/Tdap/Td (4 - Td or Tdap) 11/05/2031   Pneumonia Vaccine 30+ Years old  Completed   Zoster Vaccines- Shingrix  Completed   HPV VACCINES  Aged Out    Health Maintenance  Health Maintenance Due  Topic Date Due   OPHTHALMOLOGY EXAM  Never done   COVID-19 Vaccine (4 - 2023-24 season) 11/08/2021    Colorectal cancer screening: No longer required.   Lung Cancer Screening: (Low Dose CT Chest recommended if Age 28-80 years, 30 pack-year currently smoking OR have quit w/in 15years.) does not qualify.   Lung Cancer Screening Referral: na  Additional Screening:  Hepatitis C Screening: does not qualify  Vision Screening: Recommended annual ophthalmology exams for early detection of glaucoma and other disorders of the eye. Is the patient up to date with their annual eye exam?  No  Who is the provider or what is the name of the office in which the patient attends annual eye exams? Unknown  If pt is not established with a  provider, would they like to be referred to a provider to establish care? No .   Dental Screening: Recommended annual dental exams for proper oral hygiene  Community Resource Referral / Chronic Care Management: CRR required this visit?  No   CCM required this visit?  No      Plan:     I have personally reviewed and noted the following in the patient's chart:   Medical and social history Use of alcohol, tobacco or illicit drugs  Current medications and supplements including opioid prescriptions. Patient is not currently taking opioid prescriptions. Functional ability and status Nutritional status Physical activity Advanced directives List of other physicians Hospitalizations, surgeries, and ER visits in previous 12 months Vitals Screenings to include cognitive, depression, and falls Referrals and appointments  In addition, I have reviewed and discussed with patient certain preventive protocols, quality metrics, and best practice recommendations. A written personalized care plan for preventive services as well as general preventive health recommendations were provided to patient.     Sharon Seller, NP   06/27/2022   Place of service: [facility]

## 2022-06-27 NOTE — Patient Instructions (Addendum)
1.) Give [name] a call with the name of your eye doctor and we will request a copy of your last diabetic eye exam.    Peter Gross , Thank you for taking time to come for your Medicare Wellness Visit. I appreciate your ongoing commitment to your health goals. Please review the following plan we discussed and let me know if I can assist you in the future.   These are the goals we discussed:  Goals   None     This is a list of the screening recommended for you and due dates:  Health Maintenance  Topic Date Due   Eye exam for diabetics  Never done   COVID-19 Vaccine (4 - 2023-24 season) 11/08/2021   Flu Shot  10/09/2022   Hemoglobin A1C  11/12/2022   Complete foot exam   05/12/2023   Medicare Annual Wellness Visit  06/27/2023   DTaP/Tdap/Td vaccine (4 - Td or Tdap) 11/05/2031   Pneumonia Vaccine  Completed   Zoster (Shingles) Vaccine  Completed   HPV Vaccine  Aged Out

## 2022-06-30 ENCOUNTER — Telehealth: Payer: Self-pay

## 2022-06-30 ENCOUNTER — Other Ambulatory Visit: Payer: Self-pay | Admitting: Internal Medicine

## 2022-06-30 NOTE — Telephone Encounter (Signed)
Noted thank you

## 2022-06-30 NOTE — Telephone Encounter (Signed)
Patient's daughter called back with name of patient's eye doctor. She says her name is Product manager.  Message sent to Abbey Chatters, NP

## 2022-07-03 ENCOUNTER — Telehealth: Payer: Self-pay

## 2022-07-03 ENCOUNTER — Other Ambulatory Visit: Payer: Self-pay | Admitting: Internal Medicine

## 2022-07-03 NOTE — Telephone Encounter (Signed)
I would refill what is on his medication list

## 2022-07-03 NOTE — Telephone Encounter (Signed)
Patient's daughter called requesting refill on telmisartan. According to active medication list patient has not refilled medication since 03/05/2021. I reviewed last OV and it says to continue losartan, is patient suppose to be taken telmisartan or losartan. Please advise.  Message sent to Abbey Chatters, NP

## 2022-07-07 ENCOUNTER — Other Ambulatory Visit: Payer: Self-pay

## 2022-07-07 MED ORDER — TELMISARTAN 20 MG PO TABS: 10.0000 mg | ORAL_TABLET | Freq: Every day | ORAL | 1 refills | Status: DC

## 2022-08-27 LAB — HM DIABETES EYE EXAM

## 2022-09-16 ENCOUNTER — Other Ambulatory Visit: Payer: Self-pay | Admitting: Orthopedic Surgery

## 2022-11-14 ENCOUNTER — Ambulatory Visit (INDEPENDENT_AMBULATORY_CARE_PROVIDER_SITE_OTHER): Payer: Medicare Other | Admitting: Nurse Practitioner

## 2022-11-14 ENCOUNTER — Encounter: Payer: Self-pay | Admitting: Nurse Practitioner

## 2022-11-14 VITALS — BP 118/78 | HR 80 | Temp 97.3°F | Ht 68.5 in | Wt 156.0 lb

## 2022-11-14 DIAGNOSIS — H9113 Presbycusis, bilateral: Secondary | ICD-10-CM

## 2022-11-14 DIAGNOSIS — I10 Essential (primary) hypertension: Secondary | ICD-10-CM

## 2022-11-14 DIAGNOSIS — N138 Other obstructive and reflux uropathy: Secondary | ICD-10-CM

## 2022-11-14 DIAGNOSIS — E119 Type 2 diabetes mellitus without complications: Secondary | ICD-10-CM | POA: Diagnosis not present

## 2022-11-14 DIAGNOSIS — Z23 Encounter for immunization: Secondary | ICD-10-CM | POA: Diagnosis not present

## 2022-11-14 DIAGNOSIS — C61 Malignant neoplasm of prostate: Secondary | ICD-10-CM

## 2022-11-14 DIAGNOSIS — M159 Polyosteoarthritis, unspecified: Secondary | ICD-10-CM

## 2022-11-14 DIAGNOSIS — N401 Enlarged prostate with lower urinary tract symptoms: Secondary | ICD-10-CM

## 2022-11-14 DIAGNOSIS — K219 Gastro-esophageal reflux disease without esophagitis: Secondary | ICD-10-CM

## 2022-11-14 NOTE — Progress Notes (Signed)
Careteam: Patient Care Team: Sharon Seller, NP as PCP - General (Geriatric Medicine) Bjorn Pippin, MD as Attending Physician (Urology) Maris Berger, MD as Consulting Physician (Ophthalmology)  PLACE OF SERVICE:  [facility]  Advanced Directive information Does Patient Have a Medical Advance Directive?: Yes, Type of Advance Directive: Out of facility DNR (pink MOST or yellow form), Pre-existing out of facility DNR order (yellow form or pink MOST form): Yellow form placed in chart (order not valid for inpatient use), Does patient want to make changes to medical advance directive?: No - Patient declined  No Known Allergies  Chief Complaint  Patient presents with   Medical Management of Chronic Issues    6 month follow-up. Discuss need for eye exam, flu vaccine, covid booster and A1c      HPI: Patient is a 89 y.o. male for routine follow up.   Been followed by eye doctor frequently due to having vision.   GERD- overall controlled, occasionally will have symptoms but "not too bad"  OA- no uncontrolled pain  BPH/prostate cancer - getting up a few times a night- flomax and proscar- followed by urologist.   Reports he eats too many sweets.  Had cookies after lunch.   Reports he does have trouble chewing his food. His implant broke off and now partial does not fit correctly has not called to fix.   Review of Systems:  Review of Systems  Constitutional:  Negative for chills, fever and weight loss.  HENT:  Negative for tinnitus.   Respiratory:  Negative for cough, sputum production and shortness of breath.   Cardiovascular:  Negative for chest pain, palpitations and leg swelling.  Gastrointestinal:  Positive for heartburn. Negative for abdominal pain, constipation and diarrhea.  Genitourinary:  Negative for dysuria, frequency and urgency.  Musculoskeletal:  Negative for back pain, falls, joint pain and myalgias.  Skin: Negative.   Neurological:  Negative for dizziness  and headaches.  Psychiatric/Behavioral:  Negative for depression and memory loss. The patient does not have insomnia.     Past Medical History:  Diagnosis Date   Arthritis    Hx: of in both knees   Benign prostatic hypertrophy    COPD (chronic obstructive pulmonary disease) (HCC)    Early cataracts, bilateral    Hx: of   Enlarged prostate    GERD (gastroesophageal reflux disease)    Hx:" of once in awhile"   Heart burn    Hypertension    Obesity    Prostate cancer (HCC)    PVD (peripheral vascular disease) (HCC)    Past Surgical History:  Procedure Laterality Date   COLONOSCOPY W/ BIOPSIES AND POLYPECTOMY     Hx: of    QUADRICEPS TENDON REPAIR Left 10/01/2012   Procedure: REPAIR QUADRICEP TENDON- left;  Surgeon: Cammy Copa, MD;  Location: [facility] OR;  Service: Orthopedics;  Laterality: Left;   ROTATOR CUFF REPAIR     TONSILLECTOMY     Social History:   reports that he has never smoked. He has never used smokeless tobacco. He reports that he does not drink alcohol and does not use drugs.  Family History  Problem Relation Age of Onset   Hypertension Mother    Dementia Father    Hemachromatosis Daughter    Colon cancer Daughter     Medications: Patient's Medications  New Prescriptions   No medications on file  Previous Medications   ASPIRIN EC 81 MG TABLET    Take 81 mg by mouth daily.  FAMOTIDINE (PEPCID) 40 MG TABLET    Take 40 mg by mouth daily.   FINASTERIDE (PROSCAR) 5 MG TABLET    Take 5 mg by mouth daily.   MULTIPLE VITAMINS-MINERALS (PRESERVISION AREDS PO)    Take 1 capsule by mouth in the morning and at bedtime.   TAMSULOSIN HCL (FLOMAX) 0.4 MG CAPS    Take 0.4 mg by mouth daily.   TELMISARTAN (MICARDIS) 20 MG TABLET    Take 0.5 tablets (10 mg total) by mouth daily.  Modified Medications   No medications on file  Discontinued Medications   No medications on file    Physical Exam:  Vitals:   11/14/22 1354  BP: 118/78  Pulse: 80  Temp: (!) 97.3  F (36.3 C)  TempSrc: Temporal  SpO2: 95%  Weight: 156 lb (70.8 kg)  Height: 5' 8.5" (1.74 m)   Body mass index is 23.37 kg/m. Wt Readings from Last 3 Encounters:  11/14/22 156 lb (70.8 kg)  06/27/22 160 lb 12.8 oz (72.9 kg)  05/12/22 162 lb (73.5 kg)    Physical Exam Constitutional:      General: He is not in acute distress.    Appearance: He is well-developed. He is not diaphoretic.  HENT:     Head: Normocephalic and atraumatic.     Right Ear: External ear normal.     Left Ear: External ear normal.     Mouth/Throat:     Pharynx: No oropharyngeal exudate.  Eyes:     Conjunctiva/sclera: Conjunctivae normal.     Pupils: Pupils are equal, round, and reactive to light.  Cardiovascular:     Rate and Rhythm: Normal rate and regular rhythm.     Heart sounds: Normal heart sounds.  Pulmonary:     Effort: Pulmonary effort is normal.     Breath sounds: Normal breath sounds.  Abdominal:     General: Bowel sounds are normal.     Palpations: Abdomen is soft.  Musculoskeletal:        General: No tenderness.     Cervical back: Normal range of motion and neck supple.     Right lower leg: No edema.     Left lower leg: No edema.  Skin:    General: Skin is warm and dry.  Neurological:     Mental Status: He is alert and oriented to person, place, and time.     Labs reviewed: Basic Metabolic Panel: Recent Labs    11/25/21 1540 05/12/22 1431  NA 139 139  K 4.8 4.6  CL 103 101  CO2 27 28  GLUCOSE 103 119*  BUN 10 12  CREATININE 0.94 1.06  CALCIUM 9.5 9.4   Liver Function Tests: Recent Labs    11/25/21 1540 05/12/22 1431  AST 15 20  ALT 9 13  BILITOT 0.9 0.7  PROT 6.6 6.5   No results for input(s): "LIPASE", "AMYLASE" in the last 8760 hours. No results for input(s): "AMMONIA" in the last 8760 hours. CBC: Recent Labs    11/25/21 1540 05/12/22 1431  WBC 8.1 9.4  NEUTROABS 4,957 5,706  HGB 14.3 14.6  HCT 42.2 43.8  MCV 91.7 92.4  PLT 233 260   Lipid  Panel: No results for input(s): "CHOL", "HDL", "LDLCALC", "TRIG", "CHOLHDL", "LDLDIRECT" in the last 8760 hours. TSH: No results for input(s): "TSH" in the last 8760 hours. A1C: Lab Results  Component Value Date   HGBA1C 6.4 (H) 05/12/2022     Assessment/Plan 1. Need for influenza vaccination -  Flu Vaccine Trivalent High Dose (Fluad)  2. Prostate cancer [facility]) -followed by urology, no changes in symptoms  3. Essential hypertension -Blood pressure well controlled, goal bp <140/90 Continue current medications and dietary modifications follow metabolic panel - Complete Metabolic Panel with eGFR - CBC with Differential/Platelet  4. Diabetes mellitus type 2, diet-controlled (HCC) -diet controlled, encouraged dietary compliance, routine foot care/monitoring and to keep up with diabetic eye exams through ophthalmology  - Hemoglobin A1c  5. BPH with obstruction/lower urinary tract symptoms Stable on flomax and proscar  6. Primary osteoarthritis involving multiple joints -stable, without worsening of symptoms.  7. Presbycusis of both ears -continues with hearing aides bilaterally  8. Gastroesophageal reflux disease without esophagitis -stable. Continues pepcid.   Return in about 6 months (around 05/14/2023) for routine follow up.  Janene Harvey. Biagio Borg [facility] [phone]

## 2022-11-14 NOTE — Patient Instructions (Signed)
Call dentist to fix partial.

## 2022-11-15 LAB — CBC WITH DIFFERENTIAL/PLATELET
Absolute Monocytes: 689 cells/uL (ref 200–950)
Basophils Absolute: 68 {cells}/uL (ref 0–200)
Basophils Relative: 0.8 %
Eosinophils Absolute: 119 {cells}/uL (ref 15–500)
Eosinophils Relative: 1.4 %
HCT: 42.4 % (ref 38.5–50.0)
Hemoglobin: 14.2 g/dL (ref 13.2–17.1)
Lymphs Abs: 2134 {cells}/uL (ref 850–3900)
MCH: 31.3 pg (ref 27.0–33.0)
MCHC: 33.5 g/dL (ref 32.0–36.0)
MCV: 93.6 fL (ref 80.0–100.0)
MPV: 10 fL (ref 7.5–12.5)
Monocytes Relative: 8.1 %
Neutro Abs: 5491 {cells}/uL (ref 1500–7800)
Neutrophils Relative %: 64.6 %
Platelets: 240 10*3/uL (ref 140–400)
RBC: 4.53 10*6/uL (ref 4.20–5.80)
RDW: 13 % (ref 11.0–15.0)
Total Lymphocyte: 25.1 %
WBC: 8.5 10*3/uL (ref 3.8–10.8)

## 2022-11-15 LAB — COMPLETE METABOLIC PANEL WITH GFR
AG Ratio: 1.7 (calc) (ref 1.0–2.5)
ALT: 8 U/L — ABNORMAL LOW (ref 9–46)
AST: 14 U/L (ref 10–35)
Albumin: 4 g/dL (ref 3.6–5.1)
Alkaline phosphatase (APISO): 87 U/L (ref 35–144)
BUN: 12 mg/dL (ref 7–25)
CO2: 24 mmol/L (ref 20–32)
Calcium: 9.2 mg/dL (ref 8.6–10.3)
Chloride: 103 mmol/L (ref 98–110)
Creat: 1.05 mg/dL (ref 0.70–1.22)
Globulin: 2.3 g/dL (ref 1.9–3.7)
Glucose, Bld: 139 mg/dL (ref 65–139)
Potassium: 4.2 mmol/L (ref 3.5–5.3)
Sodium: 139 mmol/L (ref 135–146)
Total Bilirubin: 0.6 mg/dL (ref 0.2–1.2)
Total Protein: 6.3 g/dL (ref 6.1–8.1)
eGFR: 66 mL/min/{1.73_m2} (ref >=60)

## 2022-11-15 LAB — HEMOGLOBIN A1C
Hgb A1c MFr Bld: 6.1 %{Hb} — ABNORMAL HIGH (ref <5.7)
Mean Plasma Glucose: 128 mg/dL
eAG (mmol/L): 7.1 mmol/L

## 2022-11-29 ENCOUNTER — Other Ambulatory Visit: Payer: Self-pay | Admitting: Nurse Practitioner

## 2022-12-30 ENCOUNTER — Telehealth: Payer: Self-pay

## 2022-12-30 DIAGNOSIS — Z789 Other specified health status: Secondary | ICD-10-CM

## 2022-12-30 NOTE — Telephone Encounter (Signed)
This was discussed with patient at his medicare annual wellness visit, would encourage her to speak to the patient about this and can come to the next appt for [name] to discuss together if she would like.

## 2022-12-30 NOTE — Telephone Encounter (Signed)
Patient daughter [name] called in regards to father Peter Gross .Daughter stated she found a DNR document and would like the document to be removed from the patients chart .

## 2022-12-31 NOTE — Telephone Encounter (Signed)
Spoke with Peter Gross and relayed Peter Seller, NP response and she states that she and her sister had a thorough conversation with their dad and he did not know what the paper was and they explained it to him and it was decided that he did not want the DNR. Peter Gross patient did not fully understand what a DNR meant.

## 2023-01-01 NOTE — Telephone Encounter (Signed)
Order updated to reflect full code. Will discuss with patient at upcoming appt as well

## 2023-01-01 NOTE — Telephone Encounter (Signed)
Called and left voicemail for patient to return call.

## 2023-04-25 ENCOUNTER — Other Ambulatory Visit: Payer: Self-pay | Admitting: Nurse Practitioner

## 2023-05-15 ENCOUNTER — Ambulatory Visit: Payer: Medicare Other | Admitting: Nurse Practitioner

## 2023-05-15 VITALS — BP 118/82 | HR 85 | Temp 98.0°F | Resp 16 | Ht 68.5 in | Wt 164.4 lb

## 2023-05-15 DIAGNOSIS — K219 Gastro-esophageal reflux disease without esophagitis: Secondary | ICD-10-CM

## 2023-05-15 DIAGNOSIS — E119 Type 2 diabetes mellitus without complications: Secondary | ICD-10-CM

## 2023-05-15 DIAGNOSIS — C61 Malignant neoplasm of prostate: Secondary | ICD-10-CM | POA: Diagnosis not present

## 2023-05-15 DIAGNOSIS — L989 Disorder of the skin and subcutaneous tissue, unspecified: Secondary | ICD-10-CM

## 2023-05-15 DIAGNOSIS — M150 Primary generalized (osteo)arthritis: Secondary | ICD-10-CM

## 2023-05-15 DIAGNOSIS — N401 Enlarged prostate with lower urinary tract symptoms: Secondary | ICD-10-CM

## 2023-05-15 DIAGNOSIS — I10 Essential (primary) hypertension: Secondary | ICD-10-CM

## 2023-05-15 DIAGNOSIS — N138 Other obstructive and reflux uropathy: Secondary | ICD-10-CM

## 2023-05-15 NOTE — Progress Notes (Signed)
 Careteam: Patient Care Team: Sharon Seller, NP as PCP - General (Geriatric Medicine) Bjorn Pippin, MD as Attending Physician (Urology) Maris Berger, MD as Consulting Physician (Ophthalmology)   PLACE OF SERVICE: [facility]  Advanced Directive information Does Patient Have a Medical Advance Directive?: Yes, Type of Advance Directive: Healthcare Power of [place];Living will;Out of facility DNR (pink MOST or yellow form), Does patient want to make changes to medical advance directive?: No - Patient declined   No Known Allergies   Chief Complaint  Patient presents with   Medical Management of Chronic Issues    6 month follow up . Patient states he has a spot on face      HPI: Patient is a 89 y.o. male presents for 6 month follow-up. Has red spot on left side of face, has been there for about 6 months to a year. Does not hurt, was not bothersome until a few days ago, it started feeling "different", unable to describe but may be tingling, tender to touch.   Feels like he can't get around as good as he used to. Lives at home with his wife. Denies falls.   Not following any dietary restrictions, eating 3 meals per day, not as much as he used to but still eating pretty good. Unable to chew good sometimes due to implant issues but doesn't want to go back to the dentist because "he's 89 years old." He usually gardens at home for physical activity.  GERD: ongoing, has not been taking pepcid daily as prescribed  Hx of prostate cancer, denies blood in urine. No problems with urgency, frequency.   Sees ophthalmologist for left eye injections.   Review of Systems:  Review of Systems  Constitutional:  Negative for chills, fever, malaise/fatigue and weight loss.  HENT:  Positive for hearing loss.   Respiratory:  Negative for cough and shortness of breath.   Cardiovascular:  Negative for chest pain, palpitations and leg swelling.  Gastrointestinal:  Negative for abdominal pain, blood in  stool, constipation, diarrhea, nausea and vomiting.  Genitourinary:  Negative for dysuria, frequency, hematuria and urgency.  Musculoskeletal:  Positive for back pain (when picking up heavier objects) and joint pain (knees).  Skin:  Positive for rash (left side of face, next to nose).  Neurological:  Negative for dizziness, weakness and headaches.  Psychiatric/Behavioral:  Negative for depression. The patient is not nervous/anxious and does not have insomnia.     Past Medical History:  Diagnosis Date   Arthritis    Hx: of in both knees   Benign prostatic hypertrophy    COPD (chronic obstructive pulmonary disease) (HCC)    Early cataracts, bilateral    Hx: of   Enlarged prostate    GERD (gastroesophageal reflux disease)    Hx:" of once in awhile"   Heart burn    Hypertension    Obesity    Prostate cancer (HCC)    PVD (peripheral vascular disease) (HCC)     Past Surgical History:  Procedure Laterality Date   COLONOSCOPY W/ BIOPSIES AND POLYPECTOMY     Hx: of    QUADRICEPS TENDON REPAIR Left 10/01/2012   Procedure: REPAIR QUADRICEP TENDON- left;  Surgeon: Cammy Copa, MD;  Location: [facility];  Service: Orthopedics;  Laterality: Left;   ROTATOR CUFF REPAIR     TONSILLECTOMY       Social History:   reports that he has never smoked. He has never used smokeless tobacco. He reports that he does not drink alcohol  and does not use drugs.  Family History  Problem Relation Age of Onset   Hypertension Mother    Dementia Father    Hemachromatosis Daughter    Colon cancer Daughter      Medications:  Patient's Medications  New Prescriptions   No medications on file  Previous Medications   ASPIRIN EC 81 MG TABLET    Take 81 mg by mouth daily.   FAMOTIDINE (PEPCID) 40 MG TABLET    Take 40 mg by mouth daily.   FINASTERIDE (PROSCAR) 5 MG TABLET    Take 5 mg by mouth daily.   MULTIPLE VITAMINS-MINERALS (PRESERVISION AREDS PO)    Take 1 capsule by mouth in the morning and at bedtime.    TAMSULOSIN HCL (FLOMAX) 0.4 MG CAPS    Take 0.4 mg by mouth daily.   TELMISARTAN (MICARDIS) 20 MG TABLET    TAKE 1/2 TABLET BY MOUTH DAILY  Modified Medications   No medications on file  Discontinued Medications   No medications on file     Physical Exam:  Vitals:   05/15/23 1307  BP: 118/82  Pulse: 85  Resp: 16  Temp: 98 F (36.7 C)  TempSrc: Temporal  SpO2: 95%  Weight: 164 lb 6.4 oz (74.6 kg)  Height: 5' 8.5" (1.74 m)   Body mass index is 24.63 kg/m.  Wt Readings from Last 3 Encounters:  05/15/23 164 lb 6.4 oz (74.6 kg)  11/14/22 156 lb (70.8 kg)  06/27/22 160 lb 12.8 oz (72.9 kg)     Physical Exam Constitutional:      Appearance: Normal appearance. He is normal weight.  Cardiovascular:     Rate and Rhythm: Normal rate and regular rhythm.     Pulses: Normal pulses.     Heart sounds: Normal heart sounds.  Pulmonary:     Effort: Pulmonary effort is normal.     Breath sounds: Normal breath sounds.  Abdominal:     General: Bowel sounds are normal.     Palpations: Abdomen is soft.  Musculoskeletal:        General: No swelling.  Skin:    General: Skin is warm and dry.     Findings: Lesion present.     Comments: Raised round, red spot on left side of nose  Neurological:     Mental Status: He is alert. Mental status is at baseline.     Motor: Weakness present.  Psychiatric:        Mood and Affect: Mood normal.        Behavior: Behavior normal.      Labs reviewed: Basic Metabolic Panel:  Recent Labs    11/14/22 1429  NA 139  K 4.2  CL 103  CO2 24  GLUCOSE 139  BUN 12  CREATININE 1.05  CALCIUM 9.2   Liver Function Tests:  Recent Labs    11/14/22 1429  AST 14  ALT 8*  BILITOT 0.6  PROT 6.3   No results for input(s): "LIPASE", "AMYLASE" in the last 8760 hours. No results for input(s): "AMMONIA" in the last 8760 hours. CBC:  Recent Labs    11/14/22 1429  WBC 8.5  NEUTROABS 5,491  HGB 14.2  HCT 42.4  MCV 93.6  PLT 240   Lipid Panel: No  results for input(s): "CHOL", "HDL", "LDLCALC", "TRIG", "CHOLHDL", "LDLDIRECT" in the last 8760 hours. TSH: No results for input(s): "TSH" in the last 8760 hours. A1C:  Lab Results  Component Value Date   HGBA1C 6.1 (H)  11/14/2022    Assessment/Plan   1. Essential hypertension (Primary) -Controlled, BP at goal, <140/90 - COMPLETE METABOLIC PANEL WITH GFR - CBC with Differential/Platelet -Continue current medications -Encouraged dietary modifications and physical activity as tolerated  2. Diabetes mellitus type 2, diet-controlled (HCC) -Not on medication -Encouraged dietary modifications and physical activity as tolerated - Hemoglobin A1c  3. BPH with obstruction/lower urinary tract symptoms -Symptoms controlled -continue tamsulosin and finasteride  4. Prostate cancer (HCC) -No changes in symptoms  5. Primary osteoarthritis involving multiple joints -Stable, not requiring OTC pain medication  6. Gastroesophageal reflux disease without esophagitis -Ongoing -Encouraged daily use of pepcid, was not using daily  7. Skin lesion -Ambulatory referral to Dermatology for evaluation    Return in about 6 months (around 11/15/2023) for routine follow up, labs at time of visit.  Rollen Sox, Haroldine Laws MSN-FNP Student -I personally was present during the history, physical exam and medical decision-making activities of this service and have verified that the service and findings are accurately documented in the student's note [place] [facility] [phone]

## 2023-05-16 LAB — CBC WITH DIFFERENTIAL/PLATELET
Absolute Lymphocytes: 2300 {cells}/uL (ref 850–3900)
Absolute Monocytes: 929 {cells}/uL (ref 200–950)
Basophils Absolute: 74 {cells}/uL (ref 0–200)
Basophils Relative: 0.8 %
Eosinophils Absolute: 120 {cells}/uL (ref 15–500)
Eosinophils Relative: 1.3 %
HCT: 45.4 % (ref 38.5–50.0)
Hemoglobin: 15.3 g/dL (ref 13.2–17.1)
MCH: 31.5 pg (ref 27.0–33.0)
MCHC: 33.7 g/dL (ref 32.0–36.0)
MCV: 93.6 fL (ref 80.0–100.0)
MPV: 10.1 fL (ref 7.5–12.5)
Monocytes Relative: 10.1 %
Neutro Abs: 5778 {cells}/uL (ref 1500–7800)
Neutrophils Relative %: 62.8 %
Platelets: 275 10*3/uL (ref 140–400)
RBC: 4.85 10*6/uL (ref 4.20–5.80)
RDW: 12.5 % (ref 11.0–15.0)
Total Lymphocyte: 25 %
WBC: 9.2 10*3/uL (ref 3.8–10.8)

## 2023-05-16 LAB — COMPLETE METABOLIC PANEL WITH GFR
AG Ratio: 1.7 (calc) (ref 1.0–2.5)
ALT: 14 U/L (ref 9–46)
AST: 16 U/L (ref 10–35)
Albumin: 4.2 g/dL (ref 3.6–5.1)
Alkaline phosphatase (APISO): 76 U/L (ref 35–144)
BUN: 11 mg/dL (ref 7–25)
CO2: 29 mmol/L (ref 20–32)
Calcium: 9.8 mg/dL (ref 8.6–10.3)
Chloride: 101 mmol/L (ref 98–110)
Creat: 0.93 mg/dL (ref 0.70–1.22)
Globulin: 2.5 g/dL (ref 1.9–3.7)
Glucose, Bld: 129 mg/dL (ref 65–139)
Potassium: 4.6 mmol/L (ref 3.5–5.3)
Sodium: 139 mmol/L (ref 135–146)
Total Bilirubin: 0.7 mg/dL (ref 0.2–1.2)
Total Protein: 6.7 g/dL (ref 6.1–8.1)
eGFR: 76 mL/min/{1.73_m2} (ref >=60)

## 2023-05-16 LAB — HEMOGLOBIN A1C
Hgb A1c MFr Bld: 6.3 %{Hb} — ABNORMAL HIGH (ref <5.7)
Mean Plasma Glucose: 134 mg/dL
eAG (mmol/L): 7.4 mmol/L

## 2023-05-18 ENCOUNTER — Encounter: Payer: Self-pay | Admitting: Nurse Practitioner

## 2023-05-20 ENCOUNTER — Telehealth: Payer: Self-pay

## 2023-05-20 NOTE — Telephone Encounter (Signed)
 Copied from CRM [id number]. Topic: Referral - Question >> May 20, 2023 11:02 AM Corin V wrote: Reason for CRM: Patient's daughter called and said the dermatologist that he was referred to can't get him in until July. She is wanting to know if PCP can get him in sooner than that or if there is another dermatologist in the [place] area that may be able to get him in before July. Please call [name] with updates.   Routing message to referral coordinator for further follow-up

## 2023-07-03 ENCOUNTER — Ambulatory Visit: Payer: Medicare Other | Admitting: Nurse Practitioner

## 2023-07-03 ENCOUNTER — Encounter: Payer: Self-pay | Admitting: Nurse Practitioner

## 2023-07-03 VITALS — BP 132/76 | HR 105 | Temp 97.7°F | Ht 68.08 in | Wt 162.0 lb

## 2023-07-03 DIAGNOSIS — Z0000 Encounter for general adult medical examination without abnormal findings: Secondary | ICD-10-CM

## 2023-07-03 NOTE — Progress Notes (Signed)
 Subjective:   Peter Gross is a 89 y.o. male who presents for Medicare Annual/Subsequent preventive examination.  Visit Complete: In person [facility]   Cardiac Risk Factors include: hypertension;advanced age (>35men, >38 women)     Objective:    Today's Vitals   07/03/23 1319  BP: 132/76  Pulse: (!) 105  Temp: 97.7 F (36.5 C)  SpO2: 99%  Weight: 162 lb (73.5 kg)  Height: 5' 8.08" (1.729 m)   Body mass index is 24.58 kg/m.  Wt Readings from Last 3 Encounters:  07/03/23 162 lb (73.5 kg)  05/15/23 164 lb 6.4 oz (74.6 kg)  11/14/22 156 lb (70.8 kg)        07/03/2023    1:25 PM 05/15/2023    1:10 PM 11/14/2022    1:57 PM 06/27/2022    3:01 PM 05/12/2022    1:49 PM 06/24/2021    3:14 PM 04/15/2021    1:26 PM  Advanced Directives  Does Patient Have a Medical Advance Directive? Yes Yes Yes Yes Yes Yes Yes  Type of Advance Directive Living will Healthcare Power of [place];Living will;Out of facility DNR (pink MOST or yellow form) Out of facility DNR (pink MOST or yellow form) Healthcare Power of [place];Living will Healthcare Power of [place];Living will Healthcare Power of [place];Living will Healthcare Power of [place];Living will  Does patient want to make changes to medical advance directive? No - Patient declined No - Patient declined No - Patient declined No - Patient declined No - Patient declined No - Patient declined No - Patient declined  Copy of Healthcare Power of Attorney in Chart?  No - copy requested  No - copy requested No - copy requested No - copy requested No - copy requested  Pre-existing out of facility DNR order (yellow form or pink MOST form)   Yellow form placed in chart (order not valid for inpatient use)        Current Medications (verified) Outpatient Encounter Medications as of 07/03/2023  Medication Sig   aspirin  EC 81 MG tablet Take 81 mg by mouth daily.   famotidine  (PEPCID ) 40 MG tablet Take 40 mg by mouth daily.   finasteride (PROSCAR) 5 MG  tablet Take 5 mg by mouth daily.   Multiple Vitamins-Minerals (PRESERVISION AREDS PO) Take 1 capsule by mouth in the morning and at bedtime.   Tamsulosin HCl (FLOMAX) 0.4 MG CAPS Take 0.4 mg by mouth daily.   telmisartan  (MICARDIS ) 20 MG tablet TAKE 1/2 TABLET BY MOUTH DAILY   No facility-administered encounter medications on file as of 07/03/2023.    Allergies (verified) Patient has no known allergies.   History: Past Medical History:  Diagnosis Date   Arthritis    Hx: of in both knees   Benign prostatic hypertrophy    COPD (chronic obstructive pulmonary disease) (HCC)    Early cataracts, bilateral    Hx: of   Enlarged prostate    GERD (gastroesophageal reflux disease)    Hx:" of once in awhile"   Heart burn    Hypertension    Obesity    Prostate cancer (HCC)    PVD (peripheral vascular disease) (HCC)    Past Surgical History:  Procedure Laterality Date   COLONOSCOPY W/ BIOPSIES AND POLYPECTOMY     Hx: of    QUADRICEPS TENDON REPAIR Left 10/01/2012   Procedure: REPAIR QUADRICEP TENDON- left;  Surgeon: Jasmine Mesi, MD;  Location: [facility] OR;  Service: Orthopedics;  Laterality: Left;   ROTATOR CUFF REPAIR  TONSILLECTOMY     Family History  Problem Relation Age of Onset   Hypertension Mother    Dementia Father    Hemachromatosis Daughter    Colon cancer Daughter    Social History   Socioeconomic History   Marital status: Married    Spouse name: Not on file   Number of children: 3   Years of education: Not on file   Highest education level: Not on file  Occupational History   Occupation: retired  Tobacco Use   Smoking status: Never   Smokeless tobacco: Never   Tobacco comments:    "never really inhaled"  Vaping Use   Vaping status: Never Used  Substance and Sexual Activity   Alcohol use: No    Alcohol/week: 0.0 standard drinks of alcohol   Drug use: No   Sexual activity: Not on file  Other Topics Concern   Not on file  Social History Narrative    Tobacco use, amount per day now: None   Past tobacco use, amount per day: None   How many years did you use tobacco: None   Alcohol use (drinks per week): None   Diet: None   Do you drink/eat things with caffeine: Yes   Marital status:   Married                               What year were you married? 1949   Do you live in a house, apartment, assisted living, condo, trailer, etc.? House   Is it one or more stories? 1 story.   How many persons live in your home? 2   Do you have pets in your home?( please list) No   Highest Level of education completed? High School.   Current or past profession: Lineman, Theme park manager.   Do you exercise?     No                             Type and how often? N/A   Do you have a living will? Yes   Do you have a DNR form?     No                              If not, do you want to discuss one?   Do you have signed POA/HPOA forms? Yes                        If so, please bring to you appointment      Do you have any difficulty bathing or dressing yourself? No   Do you have any difficulty preparing food or eating? No   Do you have any difficulty managing your medications? No   Do you have any difficulty managing your finances? No   Do you have any difficulty affording your medications? No   Social Drivers of Corporate investment banker Strain: Not on file  Food Insecurity: Not on file  Transportation Needs: Not on file  Physical Activity: Not on file  Stress: Not on file  Social Connections: Not on file    Tobacco Counseling Counseling given: Not Answered Tobacco comments: "never really inhaled"   Clinical Intake:  Pre-visit preparation completed: Yes  Pain : No/denies pain     BMI - recorded: 24 Nutritional  Status: BMI of 19-24  Normal Nutritional Risks: None Diabetes: Yes  How often do you need to have someone help you when you read instructions, pamphlets, or other written materials from your doctor or pharmacy?: 1 -  Never         Activities of Daily Living    07/03/2023    1:34 PM  In your present state of health, do you have any difficulty performing the following activities:  Hearing? 1  Comment hearing aides  Vision? 1  Difficulty concentrating or making decisions? 1  Walking or climbing stairs? 1  Comment slower  Dressing or bathing? 0  Doing errands, shopping? 0  Preparing Food and eating ? N  Comment daughter prepares food for him and his wife  Using the Toilet? N  In the past six months, have you accidently leaked urine? N  Do you have problems with loss of bowel control? N  Managing your Medications? Y  Comment daughter helps  Managing your Finances? Y  Comment daughter helps  Housekeeping or managing your Housekeeping? Y  Comment daughter helps    Patient Care Team: Verma Gobble, NP as PCP - General (Geriatric Medicine) Homero Luster, MD as Attending Physician (Urology) Dema Filler, MD as Consulting Physician (Ophthalmology)  Indicate any recent Medical Services you may have received from other than [facility] providers in the past year (date may be approximate).     Assessment:   This is a routine wellness examination for [place].  Hearing/Vision screen Hearing Screening - Comments:: Wear hearing aind and they are helping  Vision Screening - Comments:: Last eye exam less than 12 months ago with Dr.McCuen    Goals Addressed   None    Depression Screen    07/03/2023    1:22 PM 06/27/2022    2:59 PM 05/12/2022    1:48 PM 06/24/2021    3:19 PM  PHQ 2/9 Scores  PHQ - 2 Score 0 0 0 0    Fall Risk    07/03/2023    1:21 PM 11/14/2022    1:56 PM 06/27/2022    2:58 PM 05/12/2022    1:48 PM 11/25/2021    3:15 PM  Fall Risk   Falls in the past year? 1 0 0 0 0  Number falls in past yr: 0 0 0 0 0  Injury with Fall? 0 0 0 0 0  Risk for fall due to : History of fall(s) No Fall Risks No Fall Risks  No Fall Risks  Follow up Falls evaluation completed  Falls evaluation  completed Falls evaluation completed     MEDICARE RISK AT HOME: Medicare Risk at Home Any stairs in or around the home?: No Home free of loose throw rugs in walkways, pet beds, electrical cords, etc?: Yes Adequate lighting in your home to reduce risk of falls?: Yes Life alert?: No Use of a cane, walker or w/c?: No Grab bars in the bathroom?: Yes Shower chair or bench in shower?: Yes Elevated toilet seat or a handicapped toilet?: Yes  TIMED UP AND GO:  Was the test performed?  No    Cognitive Function:    06/27/2022    3:04 PM 06/24/2021    3:19 PM  MMSE - Mini Mental State Exam  Orientation to time 5 5  Orientation to Place 4 4  Orientation to Place-comments  Did not know the name of provider  Registration 3 3  Attention/ Calculation 1 5  Recall 1 1  Recall-comments  Missed apple and penny  Language- name 2 objects 2 2  Language- repeat 1 1  Language- follow 3 step command 3 3  Language- read & follow direction 1 1  Write a sentence 1 1  Copy design 0 1  Total score 22 27        07/03/2023    1:26 PM  6CIT Screen  What Year? 4 points  What month? 0 points  What time? 0 points  Count back from 20 2 points  Months in reverse 4 points  Repeat phrase 10 points  Total Score 20 points    Immunizations Immunization History  Administered Date(s) Administered   19-influenza Whole 12/11/2011   Fluad Quad(high Dose 65+) 11/25/2021   Fluad Trivalent(High Dose 65+) 11/14/2022   Influenza Split 12/10/2012, 12/12/2013, 12/09/2014   Influenza Whole 02/07/2009, 12/09/2015, 12/08/2016   Influenza, High Dose Seasonal PF 10/23/2016, 12/17/2017   Influenza,inj,Quad PF,6+ Mos 01/09/2019   Influenza-Unspecified 12/28/2018, 11/15/2020   PFIZER(Purple Top)SARS-COV-2 Vaccination 03/29/2019, 04/19/2019   PNEUMOCOCCAL CONJUGATE-20 11/25/2021   Pneumococcal Conjugate-13 12/26/2013   Td 08/21/2004, 11/04/2021   Tdap 09/27/2011   Unspecified SARS-COV-2 Vaccination 12/08/2019    Zoster Recombinant(Shingrix) 08/15/2020, 11/12/2020    TDAP status: Up to date  Flu Vaccine status: Up to date  Pneumococcal vaccine status: Up to date  Covid-19 vaccine status: Information provided on how to obtain vaccines.   Qualifies for Shingles Vaccine? Yes   Zostavax completed No   Shingrix Completed?: Yes  Screening Tests Health Maintenance  Topic Date Due   OPHTHALMOLOGY EXAM  Never done   COVID-19 Vaccine (4 - 2024-25 season) 11/09/2022   INFLUENZA VACCINE  10/09/2023   HEMOGLOBIN A1C  11/15/2023   FOOT EXAM  05/14/2024   Medicare Annual Wellness (AWV)  07/02/2024   DTaP/Tdap/Td (4 - Td or Tdap) 11/05/2031   Pneumonia Vaccine 73+ Years old  Completed   Zoster Vaccines- Shingrix  Completed   HPV VACCINES  Aged Out   Meningococcal B Vaccine  Aged Out    Health Maintenance  Health Maintenance Due  Topic Date Due   OPHTHALMOLOGY EXAM  Never done   COVID-19 Vaccine (4 - 2024-25 season) 11/09/2022    Colorectal cancer screening: No longer required.   Lung Cancer Screening: (Low Dose CT Chest recommended if Age 38-80 years, 20 pack-year currently smoking OR have quit w/in 15years.) does not qualify.   Lung Cancer Screening Referral: na  Additional Screening:  Hepatitis C Screening: does not qualify  Vision Screening: Recommended annual ophthalmology exams for early detection of glaucoma and other disorders of the eye. Is the patient up to date with their annual eye exam?  Yes  Who is the provider or what is the name of the office in which the patient attends annual eye exams? [facility] If pt is not established with a provider, would they like to be referred to a provider to establish care? No .   Dental Screening: Recommended annual dental exams for proper oral hygiene  Diabetic Foot Exam: Diabetic Foot Exam: Completed 05/15/2023  Community Resource Referral / Chronic Care Management: CRR required this visit?  No   CCM required this visit?  No     Plan:      I have personally reviewed and noted the following in the patient's chart:   Medical and social history Use of alcohol, tobacco or illicit drugs  Current medications and supplements including opioid prescriptions. Patient is not currently taking opioid prescriptions. Functional ability and status Nutritional status  Physical activity Advanced directives List of other physicians Hospitalizations, surgeries, and ER visits in previous 12 months Vitals Screenings to include cognitive, depression, and falls Referrals and appointments  In addition, I have reviewed and discussed with patient certain preventive protocols, quality metrics, and best practice recommendations. A written personalized care plan for preventive services as well as general preventive health recommendations were provided to patient.     Verma Gobble, NP   07/03/2023

## 2023-07-03 NOTE — Patient Instructions (Signed)
  Peter Gross , Thank you for taking time to come for your Medicare Wellness Visit. I appreciate your ongoing commitment to your health goals. Please review the following plan we discussed and let me know if I can assist you in the future.   To get COVID booster at local pharmacy[facility]  We are getting the records from you last eye exam  This is a list of the screening recommended for you and due dates:  Health Maintenance  Topic Date Due   Eye exam for diabetics  Never done   COVID-19 Vaccine (4 - 2024-25 season) 11/09/2022   Flu Shot  10/09/2023   Hemoglobin A1C  11/15/2023   Complete foot exam   05/14/2024   Medicare Annual Wellness Visit  07/02/2024   DTaP/Tdap/Td vaccine (4 - Td or Tdap) 11/05/2031   Pneumonia Vaccine  Completed   Zoster (Shingles) Vaccine  Completed   HPV Vaccine  Aged Out   Meningitis B Vaccine  Aged Out

## 2023-09-02 LAB — HM DIABETES EYE EXAM

## 2023-10-11 ENCOUNTER — Other Ambulatory Visit: Payer: Self-pay | Admitting: Nurse Practitioner

## 2023-10-19 ENCOUNTER — Other Ambulatory Visit: Payer: Self-pay | Admitting: Nurse Practitioner

## 2023-10-19 MED ORDER — TELMISARTAN 20 MG PO TABS: 10.0000 mg | ORAL_TABLET | Freq: Every day | ORAL | 1 refills | Status: AC

## 2023-10-19 NOTE — Telephone Encounter (Signed)
 Copied from CRM [id number]. Topic: Clinical - Medication Refill >> Oct 19, 2023 10:49 AM Carrielelia G wrote: Medication: telmisartan  (MICARDIS ) 20 MG tablet  Has the patient contacted their pharmacy? No (Agent: If no, request that the patient contact the pharmacy for the refill. If patient does not wish to contact the pharmacy document the reason why and proceed with request.) (Agent: If yes, when and what did the pharmacy advise?)  [facility] - [place], [place] - [address] [place] [address] Phone: [phone] Fax: [phone]  Is this the correct pharmacy for this prescription? Yes If no, delete pharmacy and type the correct one.    Is the patient out of the medication? No  Has the patient been seen for an appointment in the last year OR does the patient have an upcoming appointment? Yes  Can we respond through MyChart? No  Agent: Please be advised that Rx refills may take up to 3 business days. We ask that you follow-up with your pharmacy.

## 2023-10-21 NOTE — Telephone Encounter (Signed)
 Prescription was sent into [facility] by PCP Caro Harlene POUR, NP 10/19/2023.

## 2023-10-21 NOTE — Telephone Encounter (Unsigned)
 Copied from CRM [id number]. Topic: Clinical - Prescription Issue >> Oct 21, 2023 10:57 AM Farrel B wrote: Reason for CRM: patient's daughter Ms. Waddell called in reference to her father's medication  telmisartan  (MICARDIS ) 20 MG tablet, she stated she called on 10/19/23 to have a refill sent over to [facility] pharmacy when she went to pick it up yesterday they advised her that it couldn't be fill until September, however, [facility] regard to picking up prescription I advised [facility] to [facility] to see if [facility]'s the [facility] due to [facility] and had not been changer until recently.

## 2023-11-16 ENCOUNTER — Encounter: Payer: Self-pay | Admitting: Nurse Practitioner

## 2023-11-16 ENCOUNTER — Ambulatory Visit: Admitting: Nurse Practitioner

## 2023-11-16 VITALS — BP 124/80 | HR 76 | Temp 97.3°F | Ht 68.5 in | Wt 158.0 lb

## 2023-11-16 DIAGNOSIS — E119 Type 2 diabetes mellitus without complications: Secondary | ICD-10-CM

## 2023-11-16 DIAGNOSIS — J449 Chronic obstructive pulmonary disease, unspecified: Secondary | ICD-10-CM

## 2023-11-16 DIAGNOSIS — Z23 Encounter for immunization: Secondary | ICD-10-CM

## 2023-11-16 DIAGNOSIS — K219 Gastro-esophageal reflux disease without esophagitis: Secondary | ICD-10-CM | POA: Diagnosis not present

## 2023-11-16 DIAGNOSIS — N138 Other obstructive and reflux uropathy: Secondary | ICD-10-CM

## 2023-11-16 DIAGNOSIS — I10 Essential (primary) hypertension: Secondary | ICD-10-CM | POA: Diagnosis not present

## 2023-11-16 DIAGNOSIS — N401 Enlarged prostate with lower urinary tract symptoms: Secondary | ICD-10-CM | POA: Diagnosis not present

## 2023-11-16 MED ORDER — SPIKEVAX 50 MCG/0.5ML IM SUSP: 0.5000 mL | INTRAMUSCULAR | 1 refills | Status: AC

## 2023-11-16 NOTE — Assessment & Plan Note (Signed)
 Stable at this time, contines pecid PRN

## 2023-11-16 NOTE — Assessment & Plan Note (Signed)
 Followed  by urologist, no increase in symptoms, continues on flomax and proscar

## 2023-11-16 NOTE — Patient Instructions (Signed)
 1.) Visit your local pharmacy to receive your covid booster (prescription sent)

## 2023-11-16 NOTE — Assessment & Plan Note (Signed)
Diet controlled, continue to monitor. 

## 2023-11-16 NOTE — Assessment & Plan Note (Signed)
 Blood pressure well controlled, goal bp <140/90 Continue current medications and dietary modifications follow metabolic panel

## 2023-11-16 NOTE — Assessment & Plan Note (Signed)
 No current symptoms

## 2023-11-16 NOTE — Progress Notes (Signed)
 Careteam: Patient Care Team: Caro Harlene POUR, NP as PCP - General (Geriatric Medicine) Watt Rush, MD as Attending Physician (Urology) Leslee Reusing, MD as Consulting Physician (Ophthalmology)  PLACE OF SERVICE:  [facility]  Advanced Directive information    No Known Allergies  Chief Complaint  Patient presents with   Medical Management of Chronic Issues    6 month follow-up. A1c and flu vaccine today. Patient to get covid booster at local pharmacy. Eye exam requested from Dr.McCuen     HPI:  Discussed the use of AI scribe software for clinical note transcription with the patient, who gave verbal consent to proceed.  History of Present Illness Peter Gross is a 89 year old male who presents for a routine follow-up visit. He is accompanied by his daughter.  He is doing well overall with no major concerns.  He recently experienced a fall from a lawnmower, resulting in a skin tear on his arm. The injury has healed  He experiences acid reflux a couple of times a week, for which he takes famotidine  as needed. He also uses Rolaids occasionally to manage symptoms and avoids eating a lot of fried foods.  He is under the care of a urologist for prostate cancer and benign prostatic hyperplasia. He takes tamsulosin and finasteride without issues related to urination. His last urologist visit was in March, and he is scheduled for annual follow-ups.  He takes telmisartan  for blood pressure management, using half a tablet daily. He reports that his blood pressure readings at home are usually good.  He experiences occasional constipation, which he manages with a daily stool softener. He drinks plenty of fluids to aid in bowel movements.  No chest pain, palpitations, shortness of breath, or significant knee pain. He experiences mild knee discomfort when getting up but nothing significant.  He enjoys gardening and remains active.   Review of Systems:  Review of  Systems  Constitutional:  Negative for chills, fever and weight loss.  HENT:  Negative for tinnitus.   Respiratory:  Negative for cough, sputum production and shortness of breath.   Cardiovascular:  Negative for chest pain, palpitations and leg swelling.  Gastrointestinal:  Negative for abdominal pain, constipation, diarrhea and heartburn.  Genitourinary:  Negative for dysuria, frequency and urgency.  Musculoskeletal:  Negative for back pain, falls, joint pain and myalgias.  Skin: Negative.   Neurological:  Negative for dizziness and headaches.  Psychiatric/Behavioral:  Negative for depression and memory loss. The patient does not have insomnia.     Past Medical History:  Diagnosis Date   Arthritis    Hx: of in both knees   Benign prostatic hypertrophy    COPD (chronic obstructive pulmonary disease) (HCC)    Early cataracts, bilateral    Hx: of   Enlarged prostate    GERD (gastroesophageal reflux disease)    Hx: of once in awhile   Heart burn    Hypertension    Obesity    Prostate cancer (HCC)    PVD (peripheral vascular disease) (HCC)    Past Surgical History:  Procedure Laterality Date   COLONOSCOPY W/ BIOPSIES AND POLYPECTOMY     Hx: of    QUADRICEPS TENDON REPAIR Left 10/01/2012   Procedure: REPAIR QUADRICEP TENDON- left;  Surgeon: Cordella Glendia Hutchinson, MD;  Location: [facility] OR;  Service: Orthopedics;  Laterality: Left;   ROTATOR CUFF REPAIR     TONSILLECTOMY     Social History:   reports that he has never smoked.  He has never used smokeless tobacco. He reports that he does not drink alcohol and does not use drugs.  Family History  Problem Relation Age of Onset   Hypertension Mother    Dementia Father    Hemachromatosis Daughter    Colon cancer Daughter     Medications: Patient's Medications  New Prescriptions   No medications on file  Previous Medications   ASPIRIN  EC 81 MG TABLET    Take 81 mg by mouth daily.   FAMOTIDINE  (PEPCID ) 40 MG TABLET    Take 40 mg by  mouth as needed for heartburn.   FINASTERIDE (PROSCAR) 5 MG TABLET    Take 5 mg by mouth daily.   MULTIPLE VITAMINS-MINERALS (PRESERVISION AREDS PO)    Take 1 capsule by mouth in the morning and at bedtime.   TAMSULOSIN HCL (FLOMAX) 0.4 MG CAPS    Take 0.4 mg by mouth daily.   TELMISARTAN  (MICARDIS ) 20 MG TABLET    Take 0.5 tablets (10 mg total) by mouth daily.  Modified Medications   No medications on file  Discontinued Medications   No medications on file    Physical Exam:  Vitals:   11/16/23 1055  BP: 124/80  Pulse: 76  Temp: (!) 97.3 F (36.3 C)  SpO2: 98%  Weight: 158 lb (71.7 kg)  Height: 1' (0.305 m)   Body mass index is 23.67 kg/m. Wt Readings from Last 3 Encounters:  11/16/23 158 lb (71.7 kg)  07/03/23 162 lb (73.5 kg)  05/15/23 164 lb 6.4 oz (74.6 kg)    Physical Exam Constitutional:      General: He is not in acute distress.    Appearance: He is well-developed. He is not diaphoretic.  HENT:     Head: Normocephalic and atraumatic.     Right Ear: External ear normal.     Left Ear: External ear normal.     Mouth/Throat:     Pharynx: No oropharyngeal exudate.  Eyes:     Conjunctiva/sclera: Conjunctivae normal.     Pupils: Pupils are equal, round, and reactive to light.  Cardiovascular:     Rate and Rhythm: Normal rate and regular rhythm.     Heart sounds: Normal heart sounds.  Pulmonary:     Effort: Pulmonary effort is normal.     Breath sounds: Normal breath sounds.  Abdominal:     General: Bowel sounds are normal.     Palpations: Abdomen is soft.  Musculoskeletal:        General: No tenderness.     Cervical back: Normal range of motion and neck supple.     Right lower leg: No edema.     Left lower leg: No edema.  Skin:    General: Skin is warm and dry.  Neurological:     Mental Status: He is alert and oriented to person, place, and time.     Labs reviewed: Basic Metabolic Panel: Recent Labs    05/15/23 1346  NA 139  K 4.6  CL 101  CO2  29  GLUCOSE 129  BUN 11  CREATININE 0.93  CALCIUM 9.8   Liver Function Tests: Recent Labs    05/15/23 1346  AST 16  ALT 14  BILITOT 0.7  PROT 6.7   No results for input(s): LIPASE, AMYLASE in the last 8760 hours. No results for input(s): AMMONIA in the last 8760 hours. CBC: Recent Labs    05/15/23 1346  WBC 9.2  NEUTROABS 5,778  HGB 15.3  HCT 45.4  MCV 93.6  PLT 275   Lipid Panel: No results for input(s): CHOL, HDL, LDLCALC, TRIG, CHOLHDL, LDLDIRECT in the last 8760 hours. TSH: No results for input(s): TSH in the last 8760 hours. A1C: Lab Results  Component Value Date   HGBA1C 6.3 (H) 05/15/2023     Assessment/Plan  Essential hypertension Assessment & Plan: Blood pressure well controlled, goal bp <140/90 Continue current medications and dietary modifications follow metabolic panel  Orders: -     CBC with Differential/Platelet -     Comprehensive metabolic panel with GFR  BPH with obstruction/lower urinary tract symptoms Assessment & Plan: Followed  by urologist, no increase in symptoms, continues on flomax and proscar     Gastroesophageal reflux disease without esophagitis Assessment & Plan: Stable at this time, contines pecid PRN   Diabetes mellitus type 2, diet-controlled (HCC) Assessment & Plan: Diet controlled, continue to monitor.   Orders: -     Hemoglobin A1c  Immunization due -     Flu vaccine HIGH DOSE PF(Fluzone Trivalent) -     Spikevax ; Inject 0.5 mLs into the muscle as directed.  Dispense: 0.5 mL; Refill: 1  COPD GOLD I Assessment & Plan: No current symptoms.       Return in about 6 months (around 05/15/2024) for routine follow up.  Greene Diodato K. Caro BODILY [facility] [phone]

## 2023-11-17 ENCOUNTER — Ambulatory Visit: Payer: Self-pay | Admitting: Nurse Practitioner

## 2023-11-17 LAB — COMPREHENSIVE METABOLIC PANEL WITH GFR
AG Ratio: 1.8 (calc) (ref 1.0–2.5)
ALT: 10 U/L (ref 9–46)
AST: 16 U/L (ref 10–35)
Albumin: 4.2 g/dL (ref 3.6–5.1)
Alkaline phosphatase (APISO): 77 U/L (ref 35–144)
BUN: 9 mg/dL (ref 7–25)
CO2: 27 mmol/L (ref 20–32)
Calcium: 9.6 mg/dL (ref 8.6–10.3)
Chloride: 102 mmol/L (ref 98–110)
Creat: 0.9 mg/dL (ref 0.70–1.22)
Globulin: 2.4 g/dL (ref 1.9–3.7)
Glucose, Bld: 109 mg/dL — ABNORMAL HIGH (ref 65–99)
Potassium: 4.8 mmol/L (ref 3.5–5.3)
Sodium: 137 mmol/L (ref 135–146)
Total Bilirubin: 0.6 mg/dL (ref 0.2–1.2)
Total Protein: 6.6 g/dL (ref 6.1–8.1)
eGFR: 79 mL/min/1.73m2 (ref >=60)

## 2023-11-17 LAB — CBC WITH DIFFERENTIAL/PLATELET
Absolute Lymphocytes: 2111 {cells}/uL (ref 850–3900)
Absolute Monocytes: 719 {cells}/uL (ref 200–950)
Basophils Absolute: 64 {cells}/uL (ref 0–200)
Basophils Relative: 0.7 %
Eosinophils Absolute: 73 {cells}/uL (ref 15–500)
Eosinophils Relative: 0.8 %
HCT: 45.4 % (ref 38.5–50.0)
Hemoglobin: 15.2 g/dL (ref 13.2–17.1)
MCH: 31.5 pg (ref 27.0–33.0)
MCHC: 33.5 g/dL (ref 32.0–36.0)
MCV: 94.2 fL (ref 80.0–100.0)
MPV: 9.8 fL (ref 7.5–12.5)
Monocytes Relative: 7.9 %
Neutro Abs: 6133 {cells}/uL (ref 1500–7800)
Neutrophils Relative %: 67.4 %
Platelets: 252 Thousand/uL (ref 140–400)
RBC: 4.82 Million/uL (ref 4.20–5.80)
RDW: 13.2 % (ref 11.0–15.0)
Total Lymphocyte: 23.2 %
WBC: 9.1 Thousand/uL (ref 3.8–10.8)

## 2023-11-17 LAB — HEMOGLOBIN A1C
Hgb A1c MFr Bld: 6.3 % — ABNORMAL HIGH (ref <5.7)
Mean Plasma Glucose: 134 mg/dL
eAG (mmol/L): 7.4 mmol/L

## 2024-05-16 ENCOUNTER — Ambulatory Visit: Payer: Self-pay | Admitting: Nurse Practitioner

## 2024-07-04 ENCOUNTER — Ambulatory Visit: Payer: Self-pay | Admitting: Nurse Practitioner
